# Patient Record
Sex: Female | Born: 1935 | Race: Black or African American | Hispanic: No | Smoking: Former smoker
Health system: Southern US, Community
[De-identification: ages and names within clinical notes are randomized; demographics above are authoritative.]

## PROBLEM LIST (undated history)

## (undated) DIAGNOSIS — S43004S Unspecified dislocation of right shoulder joint, sequela: Secondary | ICD-10-CM

## (undated) DIAGNOSIS — C50919 Malignant neoplasm of unspecified site of unspecified female breast: Secondary | ICD-10-CM

## (undated) DIAGNOSIS — H409 Unspecified glaucoma: Secondary | ICD-10-CM

## (undated) DIAGNOSIS — F0390 Unspecified dementia without behavioral disturbance: Secondary | ICD-10-CM

## (undated) DIAGNOSIS — I10 Essential (primary) hypertension: Secondary | ICD-10-CM

## (undated) HISTORY — PX: EYE SURGERY: SHX253

## (undated) HISTORY — PX: JOINT REPLACEMENT: SHX530

## (undated) HISTORY — PX: OTHER SURGICAL HISTORY: SHX169

## (undated) HISTORY — PX: BREAST SURGERY: SHX581

## (undated) HISTORY — PX: ABDOMINAL HYSTERECTOMY: SHX81

## (undated) HISTORY — PX: MASTECTOMY: SHX3

---

## 2002-08-26 ENCOUNTER — Encounter: Payer: Self-pay | Admitting: Neurosurgery

## 2002-08-26 ENCOUNTER — Encounter: Admission: RE | Admit: 2002-08-26 | Discharge: 2002-08-26 | Payer: Self-pay | Admitting: Neurosurgery

## 2004-03-29 ENCOUNTER — Ambulatory Visit: Payer: Self-pay | Admitting: Internal Medicine

## 2004-09-27 ENCOUNTER — Ambulatory Visit: Payer: Self-pay | Admitting: Internal Medicine

## 2004-10-19 ENCOUNTER — Ambulatory Visit: Payer: Self-pay | Admitting: Internal Medicine

## 2005-05-30 ENCOUNTER — Ambulatory Visit: Payer: Self-pay | Admitting: Internal Medicine

## 2005-06-12 ENCOUNTER — Ambulatory Visit: Payer: Self-pay | Admitting: Internal Medicine

## 2005-06-27 ENCOUNTER — Ambulatory Visit: Payer: Self-pay | Admitting: Surgery

## 2005-07-15 ENCOUNTER — Ambulatory Visit: Payer: Self-pay | Admitting: Gastroenterology

## 2005-07-16 ENCOUNTER — Other Ambulatory Visit: Payer: Self-pay

## 2005-07-23 ENCOUNTER — Observation Stay: Payer: Self-pay | Admitting: Surgery

## 2005-08-12 ENCOUNTER — Ambulatory Visit: Payer: Self-pay | Admitting: Oncology

## 2005-09-20 ENCOUNTER — Ambulatory Visit: Payer: Self-pay | Admitting: Oncology

## 2005-10-01 ENCOUNTER — Ambulatory Visit: Payer: Self-pay | Admitting: Surgery

## 2005-10-08 ENCOUNTER — Ambulatory Visit: Payer: Self-pay | Admitting: Oncology

## 2005-11-08 ENCOUNTER — Ambulatory Visit: Payer: Self-pay | Admitting: Oncology

## 2005-12-08 ENCOUNTER — Ambulatory Visit: Payer: Self-pay | Admitting: Oncology

## 2006-01-08 ENCOUNTER — Ambulatory Visit: Payer: Self-pay | Admitting: Oncology

## 2006-02-08 ENCOUNTER — Ambulatory Visit: Payer: Self-pay | Admitting: Oncology

## 2006-03-10 ENCOUNTER — Ambulatory Visit: Payer: Self-pay | Admitting: Oncology

## 2006-04-18 ENCOUNTER — Ambulatory Visit: Payer: Self-pay | Admitting: Oncology

## 2006-05-10 ENCOUNTER — Ambulatory Visit: Payer: Self-pay | Admitting: Oncology

## 2006-07-17 ENCOUNTER — Ambulatory Visit: Payer: Self-pay | Admitting: Internal Medicine

## 2006-08-09 ENCOUNTER — Ambulatory Visit: Payer: Self-pay | Admitting: Internal Medicine

## 2006-08-15 IMAGING — US ULTRASOUND RIGHT BREAST
1 series · 17 of 25 positions shown · non-contrast
Comparison: none

REASON FOR EXAM: Density  US PRN
COMMENTS:

[Series 1: ultrasound right breast · 17 of 44 slices shown]
[im 1/44]
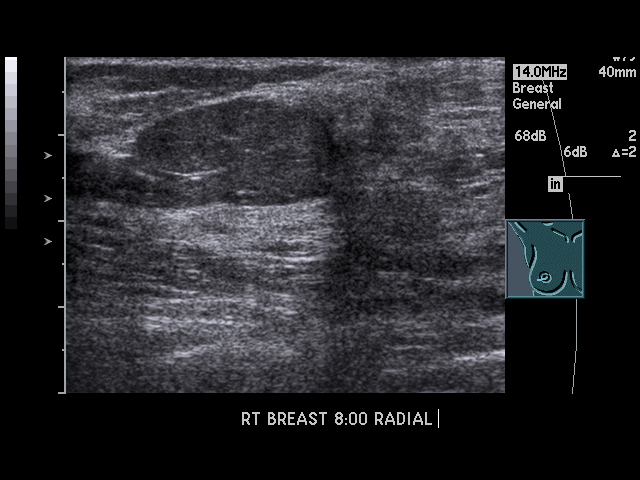
[im 4/44]
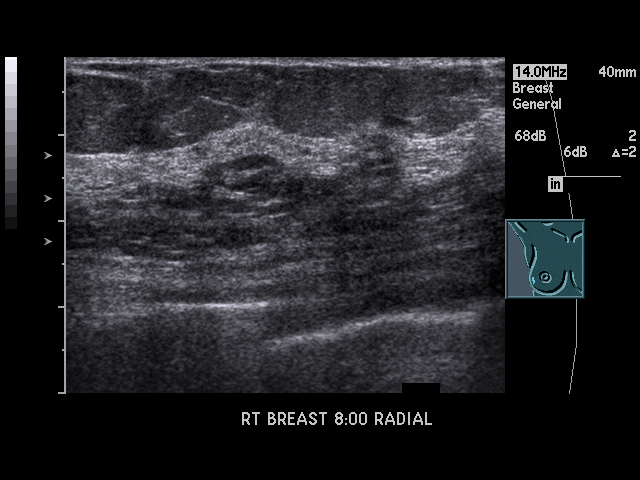
[im 6/44]
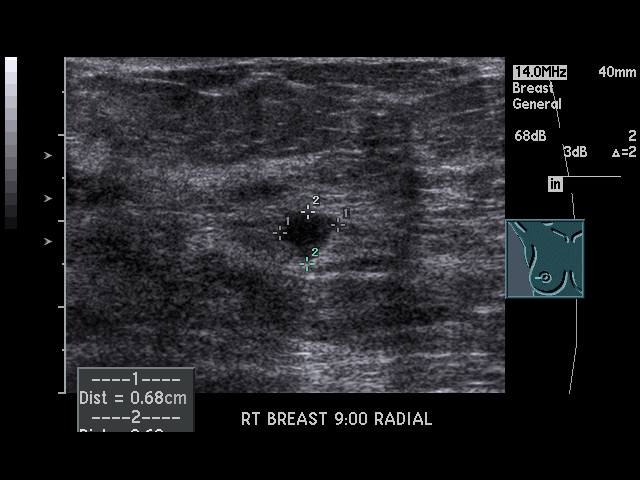
[im 9/44]
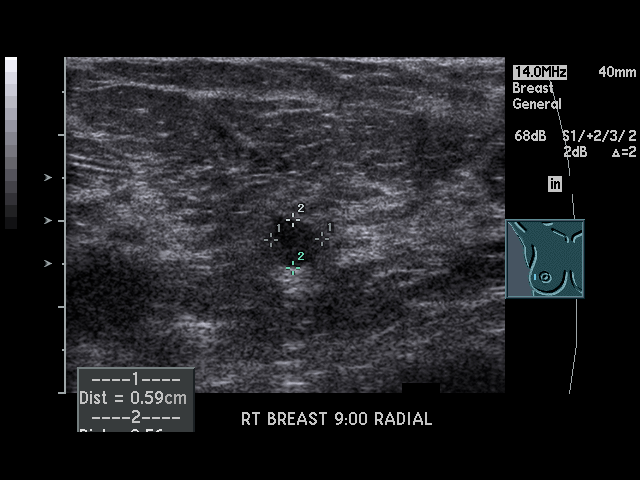
[im 11/44]
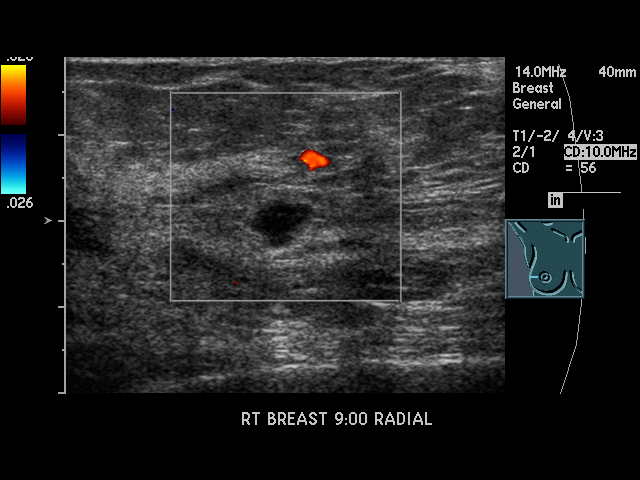
[im 15/44]
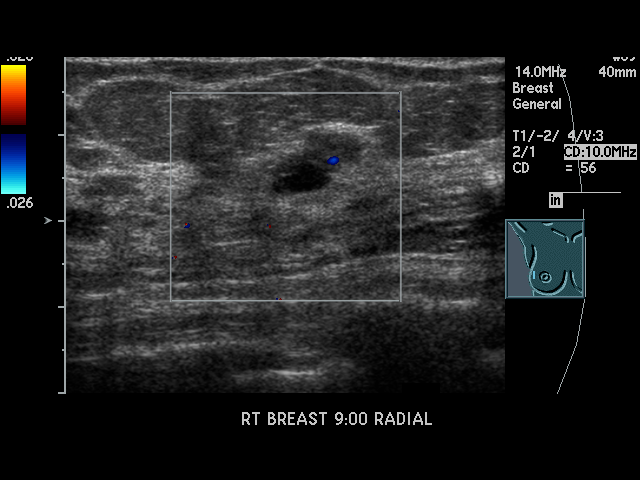
[im 17/44]
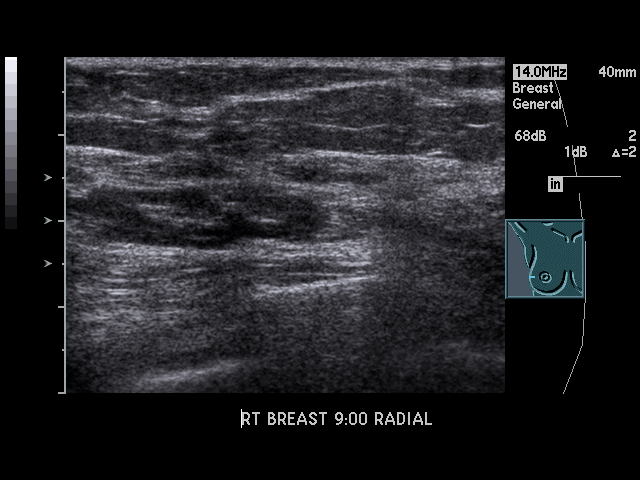
[im 20/44]
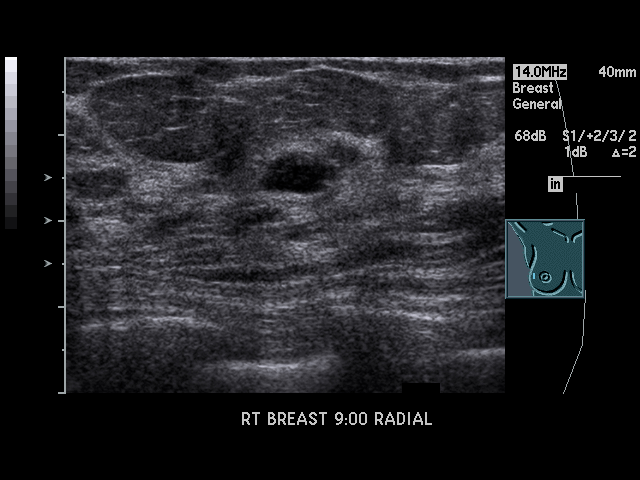
[im 22/44]
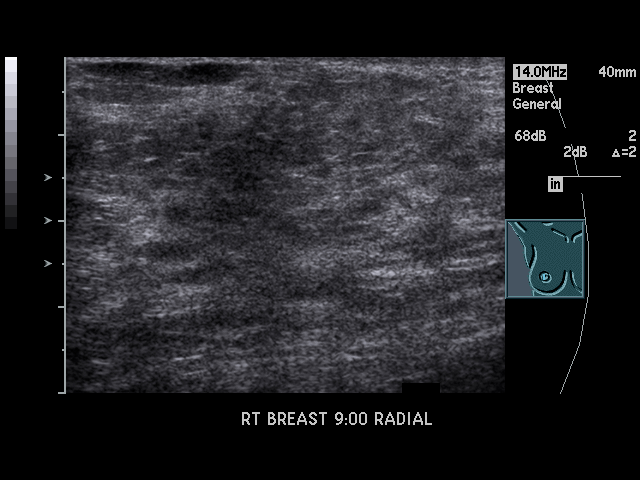
[im 24/44]
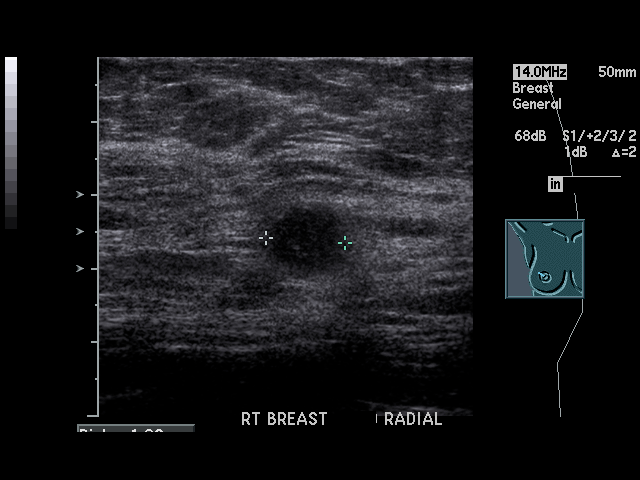
[im 27/44]
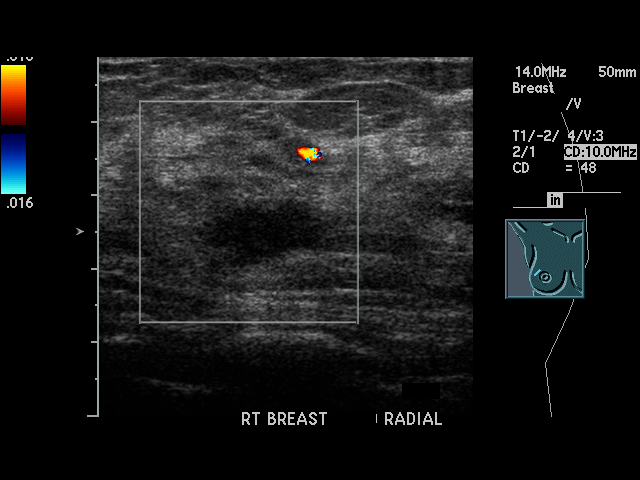
[im 29/44]
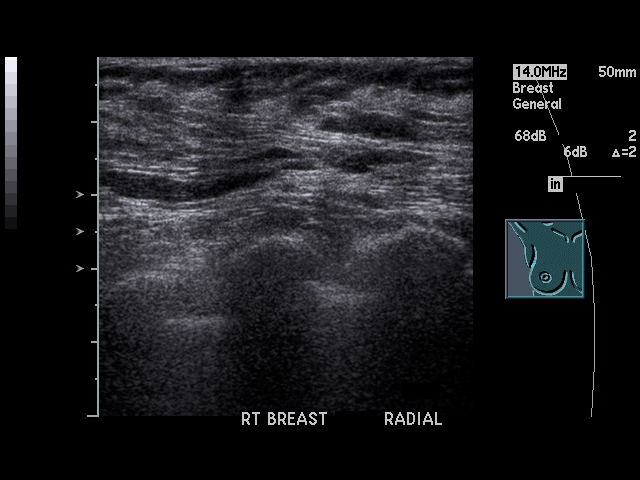
[im 33/44]
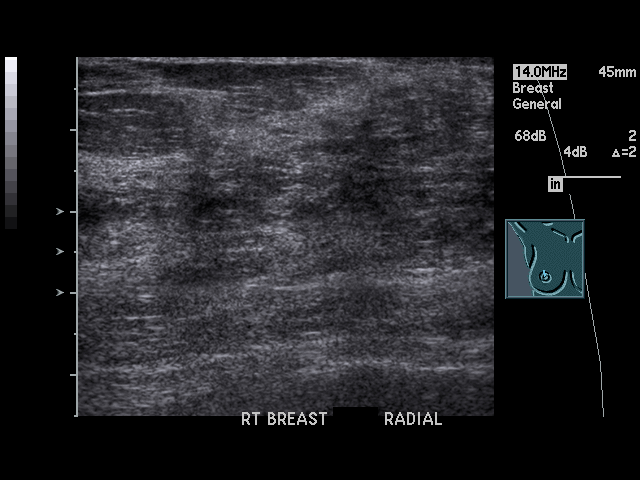
[im 35/44]
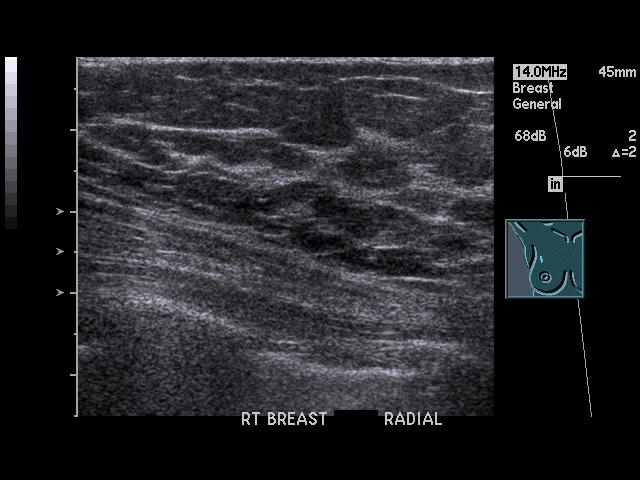
[im 38/44]
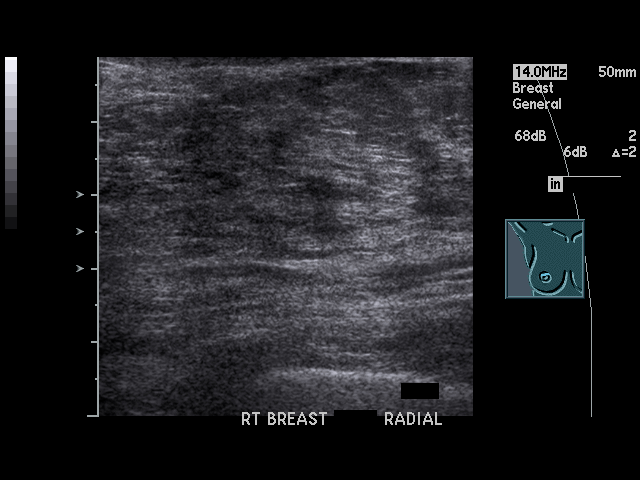
[im 40/44]
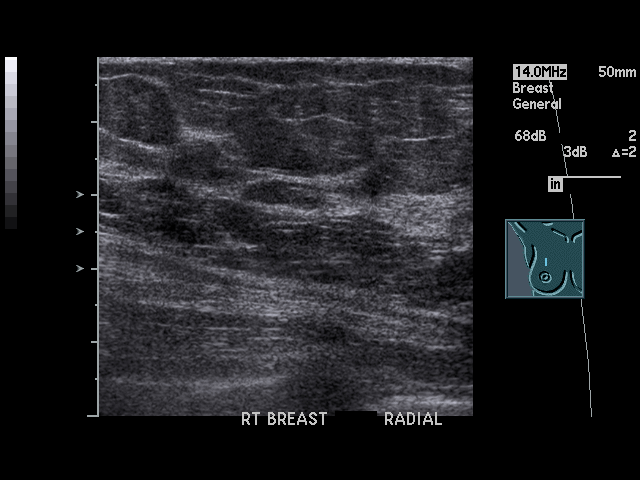
[im 44/44]
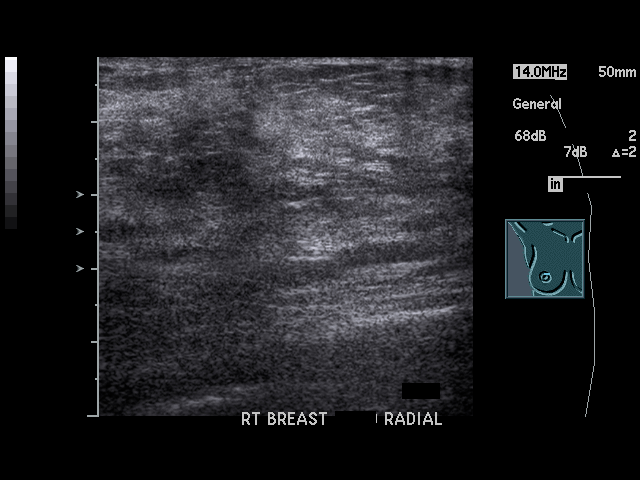

[17 of 25 positions shown; findings below may reference images not displayed]

PROCEDURE:     US  - US BREAST RIGHT  - June 12, 2005 [DATE]

RESULT:     The outer aspect of the RIGHT breast from approximately the 7
o'clock to 12 o'clock position was scanned. This was to assist in evaluation
of density seen mammographically.

At the 9 o'clock position there is density seen medially measuring 7 x 6 x 6
mm.  There are internal echoes present.  More peripherally there is a
hypoechoic nodule measuring approximately 1.3 x 1 cm with internal echoes.
At the 10 o'clock position there is a hypoechoic structure demonstrating
internal echoes measuring 1.1 x 1.6 x 0.8 cm.
IMPRESSION: There are hypoechoic non-simple appearing cystic structures present at the 9
o'clock and 10 o'clock positions.  Please see the dictation for the
supplemental mammographic views of this same day for final recommendations
and BI-RADS classification.

## 2006-10-09 ENCOUNTER — Ambulatory Visit: Payer: Self-pay | Admitting: Internal Medicine

## 2006-10-17 ENCOUNTER — Ambulatory Visit: Payer: Self-pay | Admitting: Oncology

## 2006-11-09 ENCOUNTER — Ambulatory Visit: Payer: Self-pay | Admitting: Internal Medicine

## 2006-11-09 ENCOUNTER — Ambulatory Visit: Payer: Self-pay | Admitting: Oncology

## 2007-04-11 ENCOUNTER — Ambulatory Visit: Payer: Self-pay | Admitting: Oncology

## 2007-04-15 ENCOUNTER — Ambulatory Visit: Payer: Self-pay | Admitting: Oncology

## 2007-05-11 ENCOUNTER — Ambulatory Visit: Payer: Self-pay | Admitting: Oncology

## 2007-08-09 ENCOUNTER — Ambulatory Visit: Payer: Self-pay | Admitting: Oncology

## 2007-09-03 ENCOUNTER — Ambulatory Visit: Payer: Self-pay | Admitting: Internal Medicine

## 2007-09-09 ENCOUNTER — Ambulatory Visit: Payer: Self-pay | Admitting: Internal Medicine

## 2007-10-09 ENCOUNTER — Ambulatory Visit: Payer: Self-pay | Admitting: Cardiology

## 2007-12-09 ENCOUNTER — Ambulatory Visit: Payer: Self-pay | Admitting: Oncology

## 2008-01-01 ENCOUNTER — Ambulatory Visit: Payer: Self-pay | Admitting: Oncology

## 2008-01-09 ENCOUNTER — Ambulatory Visit: Payer: Self-pay | Admitting: Oncology

## 2008-06-10 ENCOUNTER — Ambulatory Visit: Payer: Self-pay | Admitting: Oncology

## 2008-06-30 ENCOUNTER — Ambulatory Visit: Payer: Self-pay | Admitting: Oncology

## 2008-07-11 ENCOUNTER — Ambulatory Visit: Payer: Self-pay | Admitting: Oncology

## 2008-12-08 ENCOUNTER — Ambulatory Visit: Payer: Self-pay | Admitting: Oncology

## 2008-12-27 ENCOUNTER — Ambulatory Visit: Payer: Self-pay | Admitting: Oncology

## 2009-01-08 ENCOUNTER — Ambulatory Visit: Payer: Self-pay | Admitting: Oncology

## 2009-08-08 ENCOUNTER — Ambulatory Visit: Payer: Self-pay | Admitting: Oncology

## 2009-08-29 ENCOUNTER — Ambulatory Visit: Payer: Self-pay | Admitting: Ophthalmology

## 2009-09-05 ENCOUNTER — Ambulatory Visit: Payer: Self-pay | Admitting: Ophthalmology

## 2009-09-08 ENCOUNTER — Ambulatory Visit: Payer: Self-pay | Admitting: Oncology

## 2009-10-24 ENCOUNTER — Ambulatory Visit: Payer: Self-pay | Admitting: Ophthalmology

## 2009-10-31 ENCOUNTER — Ambulatory Visit: Payer: Self-pay | Admitting: Ophthalmology

## 2010-02-08 ENCOUNTER — Ambulatory Visit: Payer: Self-pay | Admitting: Oncology

## 2010-03-07 ENCOUNTER — Ambulatory Visit: Payer: Self-pay | Admitting: Oncology

## 2010-03-08 LAB — CANCER ANTIGEN 27.29: CA 27.29: 20.3 U/mL (ref 0.0–38.6)

## 2010-03-10 ENCOUNTER — Ambulatory Visit: Payer: Self-pay | Admitting: Oncology

## 2010-07-31 ENCOUNTER — Ambulatory Visit: Payer: Self-pay | Admitting: Gastroenterology

## 2010-10-03 ENCOUNTER — Ambulatory Visit: Payer: Self-pay | Admitting: Oncology

## 2010-10-04 LAB — CANCER ANTIGEN 27.29: CA 27.29: 20.9 U/mL (ref 0.0–38.6)

## 2010-10-09 ENCOUNTER — Ambulatory Visit: Payer: Self-pay | Admitting: Oncology

## 2010-11-09 ENCOUNTER — Ambulatory Visit: Payer: Self-pay | Admitting: Oncology

## 2011-02-13 ENCOUNTER — Ambulatory Visit: Payer: Self-pay | Admitting: Oncology

## 2011-02-14 LAB — CANCER ANTIGEN 27.29: CA 27.29: 23.4 U/mL (ref 0.0–38.6)

## 2011-03-11 ENCOUNTER — Ambulatory Visit: Payer: Self-pay | Admitting: Oncology

## 2014-12-27 NOTE — Progress Notes (Signed)
Patient mailing address is [address], [place], [place]  [address].  Only patient's physical address is listed in demographics ([place], [place], [place]  [address])

## 2016-11-05 ENCOUNTER — Encounter: Payer: Self-pay | Admitting: *Deleted

## 2016-11-05 ENCOUNTER — Encounter
Admission: RE | Admit: 2016-11-05 | Discharge: 2016-11-05 | Disposition: A | Discharge status: Home / Self Care | Payer: Federal, State, Local not specified - PPO | Source: Ambulatory Visit | Attending: Surgery | Admitting: Surgery

## 2016-11-05 DIAGNOSIS — M7591 Shoulder lesion, unspecified, right shoulder: Secondary | ICD-10-CM | POA: Diagnosis not present

## 2016-11-05 DIAGNOSIS — I10 Essential (primary) hypertension: Secondary | ICD-10-CM | POA: Diagnosis present

## 2016-11-05 DIAGNOSIS — R001 Bradycardia, unspecified: Secondary | ICD-10-CM | POA: Diagnosis not present

## 2016-11-05 DIAGNOSIS — Z79899 Other long term (current) drug therapy: Secondary | ICD-10-CM | POA: Diagnosis not present

## 2016-11-05 DIAGNOSIS — Z01818 Encounter for other preprocedural examination: Secondary | ICD-10-CM | POA: Insufficient documentation

## 2016-11-05 HISTORY — DX: Unspecified glaucoma: H40.9

## 2016-11-05 HISTORY — DX: Essential (primary) hypertension: I10

## 2016-11-05 LAB — TYPE AND SCREEN
ABO/RH(D): B POS
Antibody Screen: NEGATIVE

## 2016-11-05 LAB — URINALYSIS, ROUTINE W REFLEX MICROSCOPIC
BACTERIA UA: NONE SEEN
Bilirubin Urine: NEGATIVE
Glucose, UA: NEGATIVE mg/dL
Ketones, ur: NEGATIVE mg/dL
LEUKOCYTES UA: NEGATIVE
NITRITE: NEGATIVE
PH: 6 (ref 5.0–8.0)
Protein, ur: NEGATIVE mg/dL
SPECIFIC GRAVITY, URINE: 1.008 (ref 1.005–1.030)
SQUAMOUS EPITHELIAL / LPF: NONE SEEN

## 2016-11-05 LAB — CBC
HEMATOCRIT: 36.8 % (ref 35.0–47.0)
Hemoglobin: 12.3 g/dL (ref 12.0–16.0)
MCH: 32.7 pg (ref 26.0–34.0)
MCHC: 33.5 g/dL (ref 32.0–36.0)
MCV: 97.6 fL (ref 80.0–100.0)
PLATELETS: 256 10*3/uL (ref 150–440)
RBC: 3.77 MIL/uL — ABNORMAL LOW (ref 3.80–5.20)
RDW: 14.9 % — AB (ref 11.5–14.5)
WBC: 3.8 10*3/uL (ref 3.6–11.0)

## 2016-11-05 LAB — BASIC METABOLIC PANEL
ANION GAP: 9 (ref 5–15)
BUN: 27 mg/dL — AB (ref 6–20)
CALCIUM: 10.8 mg/dL — AB (ref 8.9–10.3)
CO2: 28 mmol/L (ref 22–32)
Chloride: 102 mmol/L (ref 101–111)
Creatinine, Ser: 0.98 mg/dL (ref 0.44–1.00)
GFR calc Af Amer: 59 mL/min — ABNORMAL LOW (ref >60)
GFR, EST NON AFRICAN AMERICAN: 51 mL/min — AB (ref >60)
GLUCOSE: 101 mg/dL — AB (ref 65–99)
Potassium: 3.7 mmol/L (ref 3.5–5.1)
Sodium: 139 mmol/L (ref 135–145)

## 2016-11-05 LAB — PROTIME-INR
INR: 1.06
Prothrombin Time: 13.8 seconds (ref 11.4–15.2)

## 2016-11-05 LAB — SURGICAL PCR SCREEN
MRSA, PCR: NEGATIVE
STAPHYLOCOCCUS AUREUS: POSITIVE — AB

## 2016-11-05 NOTE — Patient Instructions (Signed)
  Your procedure is scheduled OF:[facility]W. November 19, 2016 Report to Day Surgery. To find out your arrival time please call [phone] between 1PM - 3PM on Mon. November 18, 2016.  Remember: Instructions that are not followed completely may result in serious medical risk, up to and including death, or upon the discretion of your surgeon and anesthesiologist your surgery may need to be rescheduled.    _x___ 1. Do not eat food or drink liquids after midnight. No gum chewing or hard candies.     _x___ 2. No Alcohol for 24 hours before or after surgery.   ____ 3. Do Not Smoke For 24 Hours Prior to Your Surgery.   ____ 4. Bring all medications with you on the day of surgery if instructed.    __x__ 5. Notify your doctor if there is any change in your medical condition     (cold, fever, infections).       Do not wear jewelry, make-up, hairpins, clips or nail polish.  Do not wear lotions, powders, or perfumes. You may wear deodorant.  Do not shave 48 hours prior to surgery. Men may shave face and neck.  Do not bring valuables to the hospital.    [facility] is not responsible for any belongings or valuables.               Contacts, dentures or bridgework may not be worn into surgery.  Leave your suitcase in the car. After surgery it may be brought to your room.  For patients admitted to the hospital, discharge time is determined by your                treatment team.   Patients discharged the day of surgery will not be allowed to drive home.   Please read over the following fact sheets that you were given:   MRSA Information   _x___ Take these medicines the morning of surgery with A SIP OF WATER:    1. labetalol (NORMODYNE) 300 MG tablet  2. losartan (COZAAR) 100 MG tablet  3.   4.  5.  6.  ____ Fleet Enema (as directed)   __x__ Use CHG Soap as directed  ____ Use inhalers on the day of surgery  ____ Stop metformin 2 days prior to surgery    ____ Take 1/2 of usual insulin dose the  night before surgery and none on the morning of surgery.   ____ Stop Coumadin/Plavix/aspirin on   __x__ Stop Anti-inflammatories on June 5 stop advil, aleve, bc powder, aspirin.  May take tylenol   ____ Stop supplements until after surgery.    ____ Bring C-Pap to the hospital.

## 2016-11-06 LAB — URINE CULTURE
Culture: NO GROWTH
SPECIAL REQUESTS: NORMAL

## 2016-11-08 NOTE — Pre-Procedure Instructions (Signed)
UA sent to Dr. Poggi for review. 

## 2016-11-18 MED ORDER — CEFAZOLIN SODIUM-DEXTROSE 2-4 GM/100ML-% IV SOLN: 2.0000 g | Freq: Once | INTRAVENOUS | Status: DC

## 2016-11-19 ENCOUNTER — Ambulatory Visit
Admission: RE | Admit: 2016-11-19 | Discharge: 2016-11-19 | Disposition: A | Discharge status: Home / Self Care | Payer: Federal, State, Local not specified - PPO | Source: Ambulatory Visit | Attending: Surgery | Admitting: Surgery

## 2016-11-19 ENCOUNTER — Encounter
Admission: RE | Disposition: A | Discharge status: Still In Hospital | Payer: Self-pay | Source: Ambulatory Visit | Attending: Surgery

## 2016-11-19 ENCOUNTER — Emergency Department
Admission: EM | Admit: 2016-11-19 | Discharge: 2016-11-19 | Disposition: A | Discharge status: Home / Self Care | Payer: Federal, State, Local not specified - PPO | Attending: Emergency Medicine | Admitting: Emergency Medicine

## 2016-11-19 ENCOUNTER — Encounter: Payer: Self-pay | Admitting: Emergency Medicine

## 2016-11-19 ENCOUNTER — Encounter: Payer: Self-pay | Admitting: *Deleted

## 2016-11-19 DIAGNOSIS — Z87891 Personal history of nicotine dependence: Secondary | ICD-10-CM | POA: Insufficient documentation

## 2016-11-19 DIAGNOSIS — Z79899 Other long term (current) drug therapy: Secondary | ICD-10-CM | POA: Insufficient documentation

## 2016-11-19 DIAGNOSIS — Z5309 Procedure and treatment not carried out because of other contraindication: Secondary | ICD-10-CM | POA: Diagnosis not present

## 2016-11-19 DIAGNOSIS — I10 Essential (primary) hypertension: Secondary | ICD-10-CM | POA: Insufficient documentation

## 2016-11-19 DIAGNOSIS — Z96653 Presence of artificial knee joint, bilateral: Secondary | ICD-10-CM | POA: Insufficient documentation

## 2016-11-19 DIAGNOSIS — M75122 Complete rotator cuff tear or rupture of left shoulder, not specified as traumatic: Secondary | ICD-10-CM | POA: Insufficient documentation

## 2016-11-19 LAB — ABO/RH: ABO/RH(D): B POS

## 2016-11-19 SURGERY — ARTHROPLASTY, SHOULDER, TOTAL, REVERSE
Anesthesia: Choice | Laterality: Right

## 2016-11-19 MED ORDER — BUPIVACAINE-EPINEPHRINE (PF) 0.5% -1:200000 IJ SOLN
INTRAMUSCULAR | Status: AC
  Filled 2016-11-19: qty 30

## 2016-11-19 MED ORDER — NEOMYCIN-POLYMYXIN B GU 40-200000 IR SOLN
Status: AC
  Filled 2016-11-19: qty 20

## 2016-11-19 MED ORDER — CEFAZOLIN SODIUM-DEXTROSE 2-4 GM/100ML-% IV SOLN
INTRAVENOUS | Status: AC
  Filled 2016-11-19: qty 100

## 2016-11-19 MED ORDER — LABETALOL HCL 100 MG PO TABS
300.0000 mg | ORAL_TABLET | Freq: Once | ORAL | Status: AC
  Administered 2016-11-19: 300 mg via ORAL
  Filled 2016-11-19 (×2): qty 3

## 2016-11-19 MED ORDER — FAMOTIDINE 20 MG PO TABS
20.0000 mg | ORAL_TABLET | Freq: Once | ORAL | Status: AC
  Administered 2016-11-19: 20 mg via ORAL

## 2016-11-19 MED ORDER — SODIUM CHLORIDE 0.9 % IJ SOLN
INTRAMUSCULAR | Status: AC
  Filled 2016-11-19: qty 50

## 2016-11-19 MED ORDER — LACTATED RINGERS IV SOLN: INTRAVENOUS | Status: DC

## 2016-11-19 MED ORDER — FAMOTIDINE 20 MG PO TABS
ORAL_TABLET | ORAL | Status: AC
  Administered 2016-11-19: 20 mg via ORAL
  Filled 2016-11-19: qty 1

## 2016-11-19 MED ORDER — HYDRALAZINE HCL 20 MG/ML IJ SOLN: 5.0000 mg | Freq: Once | INTRAMUSCULAR | Status: DC

## 2016-11-19 MED ORDER — TRANEXAMIC ACID 1000 MG/10ML IV SOLN
INTRAVENOUS | Status: AC
  Filled 2016-11-19: qty 10

## 2016-11-19 MED ORDER — LOSARTAN POTASSIUM 50 MG PO TABS
100.0000 mg | ORAL_TABLET | Freq: Once | ORAL | Status: AC
  Administered 2016-11-19: 100 mg via ORAL
  Filled 2016-11-19: qty 2

## 2016-11-19 MED ORDER — BUPIVACAINE LIPOSOME 1.3 % IJ SUSP
INTRAMUSCULAR | Status: AC
  Filled 2016-11-19: qty 20

## 2016-11-19 SURGICAL SUPPLY — 58 items
BLADE SAGITTAL WIDE XTHICK NO (BLADE) ×3 IMPLANT
CANISTER SUCT 1200ML W/VALVE (MISCELLANEOUS) ×3 IMPLANT
CANISTER SUCT 3000ML PPV (MISCELLANEOUS) ×6 IMPLANT
CATH TRAY METER 16FR LF (MISCELLANEOUS) ×3 IMPLANT
CHLORAPREP W/TINT 26ML (MISCELLANEOUS) ×3 IMPLANT
COOLER POLAR GLACIER W/PUMP (MISCELLANEOUS) ×3 IMPLANT
CRADLE LAMINECT ARM (MISCELLANEOUS) ×3 IMPLANT
DRAPE IMP U-DRAPE 54X76 (DRAPES) ×6 IMPLANT
DRAPE INCISE IOBAN 66X45 STRL (DRAPES) ×6 IMPLANT
DRAPE INCISE IOBAN 66X60 STRL (DRAPES) ×3 IMPLANT
DRAPE SHEET LG 3/4 BI-LAMINATE (DRAPES) ×6 IMPLANT
DRAPE TABLE BACK 80X90 (DRAPES) ×3 IMPLANT
DRSG OPSITE POSTOP 4X8 (GAUZE/BANDAGES/DRESSINGS) ×3 IMPLANT
ELECT CAUTERY BLADE 6.4 (BLADE) ×3 IMPLANT
GLOVE BIO SURGEON STRL SZ7.5 (GLOVE) ×12 IMPLANT
GLOVE BIO SURGEON STRL SZ8 (GLOVE) ×12 IMPLANT
GLOVE BIOGEL PI IND STRL 8 (GLOVE) ×1 IMPLANT
GLOVE BIOGEL PI INDICATOR 8 (GLOVE) ×2
GLOVE INDICATOR 8.0 STRL GRN (GLOVE) ×3 IMPLANT
GOWN STRL REUS W/ TWL LRG LVL3 (GOWN DISPOSABLE) ×1 IMPLANT
GOWN STRL REUS W/ TWL XL LVL3 (GOWN DISPOSABLE) ×1 IMPLANT
GOWN STRL REUS W/TWL LRG LVL3 (GOWN DISPOSABLE) ×2
GOWN STRL REUS W/TWL XL LVL3 (GOWN DISPOSABLE) ×2
HOOD PEEL AWAY FLYTE STAYCOOL (MISCELLANEOUS) ×9 IMPLANT
KIT RM TURNOVER STRD PROC AR (KITS) ×3 IMPLANT
KIT STABILIZATION SHOULDER (MISCELLANEOUS) ×3 IMPLANT
MASK FACE SPIDER DISP (MASK) ×3 IMPLANT
NDL MAYO CATGUT SZ1 (NEEDLE)
NDL MAYO CATGUT SZ5 (NEEDLE)
NDL SAFETY 22GX1.5 (NEEDLE) ×3 IMPLANT
NDL SUT 5 .5 CRC TPR PNT MAYO (NEEDLE) IMPLANT
NEEDLE 18GX1X1/2 (RX/OR ONLY) (NEEDLE) ×3 IMPLANT
NEEDLE HYPO 25X1 1.5 SAFETY (NEEDLE) ×3 IMPLANT
NEEDLE MAYO CATGUT SZ1 (NEEDLE) IMPLANT
NEEDLE MAYO CATGUT SZ4 (NEEDLE) IMPLANT
NEEDLE SPNL 20GX3.5 QUINCKE YW (NEEDLE) ×3 IMPLANT
NS IRRIG 1000ML POUR BTL (IV SOLUTION) ×3 IMPLANT
PACK ARTHROSCOPY SHOULDER (MISCELLANEOUS) ×3 IMPLANT
PAD WRAPON POLAR SHDR UNIV (MISCELLANEOUS) ×1 IMPLANT
PULSAVAC PLUS IRRIG FAN TIP (DISPOSABLE) ×3
SLING ULTRA II M (MISCELLANEOUS) ×3 IMPLANT
SOL .9 NS 3000ML IRR  AL (IV SOLUTION) ×2
SOL .9 NS 3000ML IRR UROMATIC (IV SOLUTION) ×1 IMPLANT
SPONGE LAP 18X18 5 PK (GAUZE/BANDAGES/DRESSINGS) ×3 IMPLANT
STAPLER SKIN PROX 35W (STAPLE) ×3 IMPLANT
SUT ETHIBOND 0 MO6 C/R (SUTURE) ×3 IMPLANT
SUT ETHIBOND NAB CT1 #1 30IN (SUTURE) ×3 IMPLANT
SUT FIBERWIRE #2 38 BLUE 1/2 (SUTURE) ×12
SUT VIC AB 0 CT1 36 (SUTURE) ×6 IMPLANT
SUT VIC AB 2-0 CT1 27 (SUTURE) ×4
SUT VIC AB 2-0 CT1 TAPERPNT 27 (SUTURE) ×2 IMPLANT
SUTURE FIBERWR #2 38 BLUE 1/2 (SUTURE) ×4 IMPLANT
SYR 30ML LL (SYRINGE) ×6 IMPLANT
SYRINGE 10CC LL (SYRINGE) ×3 IMPLANT
TIP FAN IRRIG PULSAVAC PLUS (DISPOSABLE) ×1 IMPLANT
TUBE CONNECTING  6'X3/16 (MISCELLANEOUS) ×1
TUBE CONNECTING 6X3/16 (MISCELLANEOUS) ×2 IMPLANT
WRAPON POLAR PAD SHDR UNIV (MISCELLANEOUS) ×3

## 2016-11-19 NOTE — ED Triage Notes (Addendum)
Pt here from same day surgery due to hypertension. Pt reports she was supposed to have right shoulder surgery due to limited ROM from previous fall. Pt denies any complaints at this time, anesthesia sent down to ED for eval. Pt reports she did not take her HTN medication due to being NPO this morning but has been taking it and took it last night.

## 2016-11-19 NOTE — Progress Notes (Signed)
Patient arrived for surgery. BP checked, elevated 240s/100s. Anesthesia and surgeon asked to send patient to ED. Patient brought to ED via [facility].

## 2016-11-19 NOTE — ED Provider Notes (Signed)
[facility] Emergency Department Provider Note   ____________________________________________    I have reviewed the triage vital signs and the nursing notes.   HISTORY  Chief Complaint Hypertension     HPI Kimberly Bartlett is a 83 y.o. female who presents with complaints of high blood pressure. Patient had procedure scheduled today but was sent to the ED by anesthesiology for evaluation because of high blood pressure. Patient reports she did not take her blood pressure medications this morning because she is nothing by mouth. She reports a long history of high blood pressure. She has no physical complaints. She feels quite well. No chest pain or shortness of breath.   Past Medical History:  Diagnosis Date  . Glaucoma    both eyes  . Hypertension     There are no active problems to display for this patient.   Past Surgical History:  Procedure Laterality Date  . ABDOMINAL HYSTERECTOMY    . BREAST SURGERY     bialteral mastectomy  . EYE SURGERY     bilateral cataracts  . hammer toe Bilateral   . heel spur Bilateral   . JOINT REPLACEMENT     bilateral knees  . MASTECTOMY     bilateral    Prior to Admission medications   Medication Sig Start Date End Date Taking? Authorizing Provider  bimatoprost (LUMIGAN) 0.01 % SOLN Place 1 drop into both eyes at bedtime.   Yes [provider]  brimonidine-timolol (COMBIGAN) 0.2-0.5 % ophthalmic solution Place 1 drop into both eyes 2 (two) times daily.   Yes [provider]  Cholecalciferol (VITAMIN D3) 2000 units capsule Take 2,000 Units by mouth daily.   Yes [provider]  colchicine-probenecid 0.5-500 MG tablet Take 1 tablet by mouth daily.   Yes [provider]  labetalol (NORMODYNE) 300 MG tablet Take 300 mg by mouth 2 (two) times daily.   Yes [provider]  losartan (COZAAR) 100 MG tablet Take 100 mg by mouth daily.   Yes [provider]    torsemide (DEMADEX) 10 MG tablet Take 10 mg by mouth daily.   Yes [provider]     Allergies Patient has no known allergies.  No family history on file.  Social History Social History  Substance Use Topics  . Smoking status: Former Smoker    Types: Cigarettes    Quit date: 11/05/1948  . Smokeless tobacco: Never Used  . Alcohol use Yes     Comment: occassional wine    Review of Systems  Constitutional: No fever/chills Eyes: No visual changes.  ENT: No sore throat. Cardiovascular: Denies chest pain. Respiratory: Denies shortness of breath. Gastrointestinal: No abdominal pain.  No nausea, no vomiting.   Genitourinary: Negative for dysuria. Musculoskeletal: Negative for back pain. Skin: Negative for rash. Neurological: Negative for headaches or weakness   ____________________________________________   PHYSICAL EXAM:  VITAL SIGNS: ED Triage Vitals [11/19/16 1018]  Enc Vitals Group     BP (!) 171/103     Pulse Rate (!) 54     Resp 16     Temp 98.3 F (36.8 C)     Temp Source Oral     SpO2 99 %     Weight 81.6 kg (180 lb)     Height 1.575 m (5\' 2" )     Head Circumference      Peak Flow      Pain Score      Pain Loc  Pain Edu?      Excl. in [place]?     Constitutional: Alert and oriented. No acute distress. Pleasant and interactive Eyes: Conjunctivae are normal.  Head: Atraumatic. Nose: No congestion/rhinnorhea. Mouth/Throat: Mucous membranes are moist.    Cardiovascular: Normal rate, regular rhythm. Grossly normal heart sounds.  Good peripheral circulation. Respiratory: Normal respiratory effort.  No retractions. Lungs CTAB.  Genitourinary: deferred Musculoskeletal: No lower extremity tenderness nor edema.  Warm and well perfused Neurologic:  Normal speech and language. No gross focal neurologic deficits are appreciated.  Skin:  Skin is warm, dry and intact. No rash noted. Psychiatric: Mood and affect are normal. Speech and behavior are  normal.  ____________________________________________   LABS (all labs ordered are listed, but only abnormal results are displayed)  Labs Reviewed - No data to display ____________________________________________  EKG  ED ECG REPORT I, Lavonia Drafts, the attending physician, personally viewed and interpreted this ECG.  Date: 11/19/2016 EKG Time: 10:20 AM Rate: 58 Rhythm: normal sinus rhythm QRS Axis: normal Intervals: normal ST/T Wave abnormalities: Nonspecific Narrative Interpretation: unremarkable  ____________________________________________  RADIOLOGY  None ____________________________________________   PROCEDURES  Procedure(s) performed: No    Critical Care performed: No ____________________________________________   INITIAL IMPRESSION / ASSESSMENT AND PLAN / ED COURSE  Pertinent labs & imaging results that were available during my care of the patient were reviewed by me and considered in my medical decision making (see chart for details).  Patient well-appearing and asymptomatic. We will give her her home medications and reevaluate  Patient had some improvement with home medications. I recommended IV medications but the patient declined and said she was ready to go home. She states it will improve when she goes home and rests. She knows to return if any symptoms develop. She understands that her blood pressure is significantly elevated and potentially dangerous but has decisional capacity and does not want any IV medications or further treatment this time    ____________________________________________   FINAL CLINICAL IMPRESSION(S) / ED DIAGNOSES  Final diagnoses:  Essential hypertension      NEW MEDICATIONS STARTED DURING THIS VISIT:  Discharge Medication List as of 11/19/2016 12:35 PM       Note:  This document was prepared using Dragon voice recognition software and may include unintentional dictation errors.    Lavonia Drafts,  MD 11/19/16 [phone]

## 2016-11-19 NOTE — ED Notes (Signed)
Pt denies any pain, blurred vision, dizziness. Pt ambulatory around room without assistance. Pt states she feels fine and wants to go home.

## 2016-11-19 NOTE — ED Notes (Signed)
Patient denies pain and is resting comfortably.  

## 2016-11-19 NOTE — H&P (Addendum)
Procedure canceled as patient's blood pressure was 286/106 by machine and 240/100 manually. Therefore, the decision was made I anesthesia to cancel the procedure and have the patient admitted to the hospitalist service so that her blood pressure could be managed more optimally.  This was discussed with the patient who is in agreement with this plan. As soon as her blood pressure was under control, she will be rescheduled for her reverse right total shoulder arthroplasty.

## 2016-11-19 NOTE — ED Notes (Signed)
Pt assisted to toilet 

## 2017-11-15 ENCOUNTER — Emergency Department: Payer: Medicare Other

## 2017-11-15 ENCOUNTER — Other Ambulatory Visit: Payer: Self-pay

## 2017-11-15 ENCOUNTER — Inpatient Hospital Stay
Admission: EM | Admit: 2017-11-15 | Discharge: 2017-11-18 | DRG: 683 | Disposition: A | Discharge status: Skilled Nursing Facility | Payer: Medicare Other | Attending: Internal Medicine | Admitting: Internal Medicine

## 2017-11-15 ENCOUNTER — Encounter: Payer: Self-pay | Admitting: Emergency Medicine

## 2017-11-15 DIAGNOSIS — Z87891 Personal history of nicotine dependence: Secondary | ICD-10-CM | POA: Diagnosis not present

## 2017-11-15 DIAGNOSIS — M109 Gout, unspecified: Secondary | ICD-10-CM | POA: Diagnosis present

## 2017-11-15 DIAGNOSIS — Z6827 Body mass index (BMI) 27.0-27.9, adult: Secondary | ICD-10-CM | POA: Diagnosis not present

## 2017-11-15 DIAGNOSIS — M79652 Pain in left thigh: Secondary | ICD-10-CM | POA: Diagnosis present

## 2017-11-15 DIAGNOSIS — Z79899 Other long term (current) drug therapy: Secondary | ICD-10-CM

## 2017-11-15 DIAGNOSIS — W19XXXA Unspecified fall, initial encounter: Secondary | ICD-10-CM | POA: Diagnosis present

## 2017-11-15 DIAGNOSIS — R001 Bradycardia, unspecified: Secondary | ICD-10-CM | POA: Diagnosis not present

## 2017-11-15 DIAGNOSIS — T68XXXA Hypothermia, initial encounter: Secondary | ICD-10-CM | POA: Diagnosis present

## 2017-11-15 DIAGNOSIS — E669 Obesity, unspecified: Secondary | ICD-10-CM | POA: Diagnosis present

## 2017-11-15 DIAGNOSIS — N179 Acute kidney failure, unspecified: Secondary | ICD-10-CM | POA: Diagnosis not present

## 2017-11-15 DIAGNOSIS — H409 Unspecified glaucoma: Secondary | ICD-10-CM | POA: Diagnosis present

## 2017-11-15 DIAGNOSIS — E876 Hypokalemia: Secondary | ICD-10-CM | POA: Diagnosis present

## 2017-11-15 DIAGNOSIS — M6282 Rhabdomyolysis: Secondary | ICD-10-CM

## 2017-11-15 DIAGNOSIS — F0390 Unspecified dementia without behavioral disturbance: Secondary | ICD-10-CM | POA: Diagnosis present

## 2017-11-15 DIAGNOSIS — M25561 Pain in right knee: Secondary | ICD-10-CM

## 2017-11-15 DIAGNOSIS — Z96653 Presence of artificial knee joint, bilateral: Secondary | ICD-10-CM | POA: Diagnosis present

## 2017-11-15 DIAGNOSIS — Z853 Personal history of malignant neoplasm of breast: Secondary | ICD-10-CM

## 2017-11-15 DIAGNOSIS — R531 Weakness: Secondary | ICD-10-CM | POA: Diagnosis present

## 2017-11-15 DIAGNOSIS — E785 Hyperlipidemia, unspecified: Secondary | ICD-10-CM | POA: Diagnosis present

## 2017-11-15 DIAGNOSIS — I10 Essential (primary) hypertension: Secondary | ICD-10-CM | POA: Diagnosis present

## 2017-11-15 DIAGNOSIS — M79606 Pain in leg, unspecified: Secondary | ICD-10-CM

## 2017-11-15 DIAGNOSIS — M79651 Pain in right thigh: Secondary | ICD-10-CM | POA: Diagnosis present

## 2017-11-15 HISTORY — DX: Unspecified dislocation of right shoulder joint, sequela: S43.004S

## 2017-11-15 HISTORY — DX: Malignant neoplasm of unspecified site of unspecified female breast: C50.919

## 2017-11-15 LAB — URINALYSIS, COMPLETE (UACMP) WITH MICROSCOPIC
BACTERIA UA: NONE SEEN
Bilirubin Urine: NEGATIVE
Glucose, UA: NEGATIVE mg/dL
Ketones, ur: NEGATIVE mg/dL
LEUKOCYTES UA: NEGATIVE
NITRITE: NEGATIVE
PH: 5 (ref 5.0–8.0)
Protein, ur: 30 mg/dL — AB
Specific Gravity, Urine: 1.013 (ref 1.005–1.030)

## 2017-11-15 LAB — COMPREHENSIVE METABOLIC PANEL
ALK PHOS: 71 U/L (ref 38–126)
ALT: 78 U/L — ABNORMAL HIGH (ref 14–54)
AST: 159 U/L — AB (ref 15–41)
Albumin: 3.6 g/dL (ref 3.5–5.0)
Anion gap: 14 (ref 5–15)
BILIRUBIN TOTAL: 0.7 mg/dL (ref 0.3–1.2)
BUN: 76 mg/dL — AB (ref 6–20)
CALCIUM: 10.6 mg/dL — AB (ref 8.9–10.3)
CO2: 26 mmol/L (ref 22–32)
Chloride: 95 mmol/L — ABNORMAL LOW (ref 101–111)
Creatinine, Ser: 2.02 mg/dL — ABNORMAL HIGH (ref 0.44–1.00)
GFR calc Af Amer: 24 mL/min — ABNORMAL LOW (ref >60)
GFR calc non Af Amer: 21 mL/min — ABNORMAL LOW (ref >60)
GLUCOSE: 118 mg/dL — AB (ref 65–99)
Potassium: 3 mmol/L — ABNORMAL LOW (ref 3.5–5.1)
Sodium: 135 mmol/L (ref 135–145)
TOTAL PROTEIN: 7.6 g/dL (ref 6.5–8.1)

## 2017-11-15 LAB — CBC WITH DIFFERENTIAL/PLATELET
BASOS ABS: 0 10*3/uL (ref 0–0.1)
BASOS PCT: 0 %
EOS ABS: 0 10*3/uL (ref 0–0.7)
EOS PCT: 0 %
HCT: 39.7 % (ref 35.0–47.0)
Hemoglobin: 13.5 g/dL (ref 12.0–16.0)
LYMPHS PCT: 3 %
Lymphs Abs: 0.4 10*3/uL — ABNORMAL LOW (ref 1.0–3.6)
MCH: 33.1 pg (ref 26.0–34.0)
MCHC: 33.9 g/dL (ref 32.0–36.0)
MCV: 97.8 fL (ref 80.0–100.0)
MONO ABS: 0.6 10*3/uL (ref 0.2–0.9)
Monocytes Relative: 5 %
Neutro Abs: 10.4 10*3/uL — ABNORMAL HIGH (ref 1.4–6.5)
Neutrophils Relative %: 92 %
PLATELETS: 329 10*3/uL (ref 150–440)
RBC: 4.06 MIL/uL (ref 3.80–5.20)
RDW: 13.4 % (ref 11.5–14.5)
WBC: 11.4 10*3/uL — AB (ref 3.6–11.0)

## 2017-11-15 LAB — MAGNESIUM: MAGNESIUM: 2.4 mg/dL (ref 1.7–2.4)

## 2017-11-15 LAB — TROPONIN I: Troponin I: 0.04 ng/mL (ref <0.03)

## 2017-11-15 LAB — ETHANOL: Alcohol, Ethyl (B): <10 mg/dL (ref <10)

## 2017-11-15 LAB — AMMONIA: AMMONIA: 18 umol/L (ref 9–35)

## 2017-11-15 LAB — TSH: TSH: 1.527 u[IU]/mL (ref 0.350–4.500)

## 2017-11-15 LAB — CK: CK TOTAL: 10635 U/L — AB (ref 38–234)

## 2017-11-15 MED ORDER — ACETAMINOPHEN 650 MG RE SUPP: 650.0000 mg | Freq: Four times a day (QID) | RECTAL | Status: DC | PRN

## 2017-11-15 MED ORDER — LABETALOL HCL 200 MG PO TABS
300.0000 mg | ORAL_TABLET | Freq: Two times a day (BID) | ORAL | Status: DC
  Administered 2017-11-15 – 2017-11-17 (×4): 300 mg via ORAL
  Filled 2017-11-15 (×5): qty 1

## 2017-11-15 MED ORDER — AMLODIPINE BESYLATE 5 MG PO TABS
5.0000 mg | ORAL_TABLET | Freq: Every day | ORAL | Status: DC
  Administered 2017-11-16 – 2017-11-18 (×3): 5 mg via ORAL
  Filled 2017-11-15 (×3): qty 1

## 2017-11-15 MED ORDER — ONDANSETRON HCL 4 MG/2ML IJ SOLN: 4.0000 mg | Freq: Four times a day (QID) | INTRAMUSCULAR | Status: DC | PRN

## 2017-11-15 MED ORDER — SODIUM CHLORIDE 0.9 % IV BOLUS
1000.0000 mL | Freq: Once | INTRAVENOUS | Status: AC
  Administered 2017-11-15: 1000 mL via INTRAVENOUS

## 2017-11-15 MED ORDER — POTASSIUM CHLORIDE 2 MEQ/ML IV SOLN
INTRAVENOUS | Status: DC
  Administered 2017-11-15 – 2017-11-18 (×4): via INTRAVENOUS
  Filled 2017-11-15 (×6): qty 1000

## 2017-11-15 MED ORDER — ONDANSETRON HCL 4 MG PO TABS: 4.0000 mg | ORAL_TABLET | Freq: Four times a day (QID) | ORAL | Status: DC | PRN

## 2017-11-15 MED ORDER — LATANOPROST 0.005 % OP SOLN
1.0000 [drp] | Freq: Every day | OPHTHALMIC | Status: DC
  Administered 2017-11-15 – 2017-11-17 (×3): 1 [drp] via OPHTHALMIC
  Filled 2017-11-15: qty 2.5

## 2017-11-15 MED ORDER — ENOXAPARIN SODIUM 40 MG/0.4ML ~~LOC~~ SOLN
30.0000 mg | SUBCUTANEOUS | Status: DC
  Administered 2017-11-15 – 2017-11-16 (×2): 30 mg via SUBCUTANEOUS
  Filled 2017-11-15 (×2): qty 0.4

## 2017-11-15 MED ORDER — ACETAMINOPHEN 325 MG PO TABS
650.0000 mg | ORAL_TABLET | Freq: Four times a day (QID) | ORAL | Status: DC | PRN
  Administered 2017-11-18: 650 mg via ORAL
  Filled 2017-11-15: qty 2

## 2017-11-15 MED ORDER — VITAMIN D3 25 MCG (1000 UNIT) PO TABS
2000.0000 [IU] | ORAL_TABLET | Freq: Every day | ORAL | Status: DC
  Administered 2017-11-16 – 2017-11-18 (×3): 2000 [IU] via ORAL
  Filled 2017-11-15 (×6): qty 2

## 2017-11-15 NOTE — ED Triage Notes (Signed)
Pt to ED via [place] from home for unwitnessed fall. Unknown how long how pt had been in the floor. Pt c/o right leg and knee pain. EMS states that pt has reported increased confusion over the past week. Pt has strong urine smell on arrival. EMS states that pt was unable to bear weight on either leg. Pt is in NAD at this time.

## 2017-11-15 NOTE — ED Provider Notes (Signed)
[facility] Emergency Department Provider Note  ____________________________________________   First MD Initiated Contact with Patient 11/15/17 1339     (approximate)  I have reviewed the triage vital signs and the nursing notes.   HISTORY  Chief Complaint Fall  Level 5 exemption history limited by the patient's clinical condition  HPI Kimberly Bartlett is a 84 y.o. female who comes to the emergency department via EMS after being found on the floor of her home.  The patient lives alone and does not know how she ended up on the floor.  She was not picking up the phone today and family became concerned so they came over and found the patient lying on the ground unable to get up.  The patient herself reports severe pain on her right leg primarily in the right hip.  She is not sure if she passed out.  She does not know when she last had food or water.  Past Medical History:  Diagnosis Date  . Breast cancer ([place])   . Dislocated shoulder, right, sequela   . Glaucoma    both eyes  . Hypertension     Patient Active Problem List   Diagnosis Date Noted  . Acute kidney injury ([place]) 11/15/2017    Past Surgical History:  Procedure Laterality Date  . ABDOMINAL HYSTERECTOMY    . BREAST SURGERY     bialteral mastectomy  . EYE SURGERY     bilateral cataracts  . hammer toe Bilateral   . heel spur Bilateral   . JOINT REPLACEMENT     bilateral knees  . MASTECTOMY     bilateral    Prior to Admission medications   Medication Sig Start Date End Date Taking? Authorizing Provider  amLODipine (NORVASC) 5 MG tablet Take 1 tablet by mouth daily. 11/07/17  Yes [provider]  bimatoprost (LUMIGAN) 0.01 % SOLN Place 1 drop into both eyes at bedtime.   Yes [provider]  Cholecalciferol (VITAMIN D3) 2000 units capsule Take 2,000 Units by mouth daily.   Yes [provider]  labetalol (NORMODYNE) 300 MG tablet Take 300 mg by mouth 2 (two) times  daily.   Yes [provider]  losartan-hydrochlorothiazide (HYZAAR) 100-25 MG tablet Take 1 tablet by mouth daily. 11/07/17  Yes [provider]  torsemide (DEMADEX) 10 MG tablet Take 10 mg by mouth daily.   Yes [provider]    Allergies Patient has no known allergies.  Family History  Problem Relation Age of Onset  . Hypertension Mother     Social History Social History   Tobacco Use  . Smoking status: Former Smoker    Types: Cigarettes    Last attempt to quit: 11/05/1948    Years since quitting: 69.0  . Smokeless tobacco: Never Used  Substance Use Topics  . Alcohol use: Yes    Comment: occassional wine  . Drug use: No    Review of Systems Level 5 exemption history limited by the patient's clinical condition  ____________________________________________   PHYSICAL EXAM:  VITAL SIGNS: ED Triage Vitals  Enc Vitals Group     BP      Pulse      Resp      Temp      Temp src      SpO2      Weight      Height      Head Circumference      Peak Flow  Pain Score      Pain Loc      Pain Edu?      Excl. in [place]?     Constitutional: Alert and oriented x1 to her name.  Profoundly confused Eyes: PERRL EOMI. midrange and brisk Head: Atraumatic. Nose: No congestion/rhinnorhea. Mouth/Throat: No trismus Neck: No stridor.   Cardiovascular: Normal rate, regular rhythm. Grossly normal heart sounds.  Good peripheral circulation. Respiratory: Normal respiratory effort.  No retractions. Lungs CTAB and moving good air Gastrointestinal: Soft nontender Musculoskeletal: Right leg internally rotated with extreme discomfort when ranging Neurologic: Moves all 4 Skin:  Skin is warm, dry and intact. No rash noted. Psychiatric: Quite confused   ____________________________________________   DIFFERENTIAL includes but not limited to  Hip fracture, hip dislocation, dehydration, syncope, mechanical fall,  rhabdomyolysis ____________________________________________   LABS (all labs ordered are listed, but only abnormal results are displayed)  Labs Reviewed  COMPREHENSIVE METABOLIC PANEL - Abnormal; Notable for the following components:      Result Value   Potassium 3.0 (*)    Chloride 95 (*)    Glucose, Bld 118 (*)    BUN 76 (*)    Creatinine, Ser 2.02 (*)    Calcium 10.6 (*)    AST 159 (*)    ALT 78 (*)    GFR calc non Af Amer 21 (*)    GFR calc Af Amer 24 (*)    All other components within normal limits  TROPONIN I - Abnormal; Notable for the following components:   Troponin I 0.04 (*)    All other components within normal limits  CBC WITH DIFFERENTIAL/PLATELET - Abnormal; Notable for the following components:   WBC 11.4 (*)    Neutro Abs 10.4 (*)    Lymphs Abs 0.4 (*)    All other components within normal limits  CK - Abnormal; Notable for the following components:   Total CK 10,635 (*)    All other components within normal limits  URINE CULTURE  ETHANOL  AMMONIA  URINALYSIS, COMPLETE (UACMP) WITH MICROSCOPIC  CK  MAGNESIUM  RPR  TSH    Lab work reviewed by me with significantly elevated CK and acute kidney injury consistent with rhabdomyolysis __________________________________________  EKG  ED ECG REPORT I, Darel Hong, the attending physician, personally viewed and interpreted this ECG.  Date: 11/15/2017 EKG Time:  Rate: 59 Rhythm: Sinus bradycardia QRS Axis: Leftward axis Intervals: normal ST/T Wave abnormalities: normal Narrative Interpretation: no evidence of acute ischemia  ____________________________________________  RADIOLOGY  Head CT reviewed by me with no acute disease Pelvis CT reviewed by me with no acute disease Chest x-ray reviewed by me with no acute disease ____________________________________________   PROCEDURES  Procedure(s) performed: no  .Critical Care Performed by: Darel Hong, MD Authorized by: Darel Hong,  MD   Critical care provider statement:    Critical care time (minutes):  30   Critical care time was exclusive of:  Separately billable procedures and treating other patients   Critical care was necessary to treat or prevent imminent or life-threatening deterioration of the following conditions:  Renal failure   Critical care was time spent personally by me on the following activities:  Development of treatment plan with patient or surrogate, discussions with consultants, evaluation of patient's response to treatment, examination of patient, obtaining history from patient or surrogate, ordering and performing treatments and interventions, ordering and review of laboratory studies, ordering and review of radiographic studies, pulse oximetry, re-evaluation of patient's condition and review of old  charts    Critical Care performed: Yes  Observation: no ____________________________________________   INITIAL IMPRESSION / ASSESSMENT AND PLAN / ED COURSE  Pertinent labs & imaging results that were available during my care of the patient were reviewed by me and considered in my medical decision making (see chart for details).  The patient arrives quite confused after being found down in her home for an unclear period of time.  Differential is broad but includes intracerebral hemorrhage, stroke, metabolic derangement, fracture, and rhabdomyolysis.  Head CT along with x-ray of her right hip and chest x-ray and broad labs are pending.  We will begin with hydration for now.  The patient's hip x-ray is negative so I will add on a CT scan to evaluate for occult fracture.  Fortunately the patient's imaging is negative.  She does have acute kidney injury along with a CK of 10,000 consistent with rhabdomyolysis.  She is likewise 93 degrees core temperature so she is been placed on a bear hugger.  At this point given her multiple metabolic abnormalities she requires inpatient admission for continued fluid  resuscitation and treatment of her rhabdomyolysis and acute kidney injury.  Family understands and agrees with the plan.  I then discussed with the hospitalist who has graciously agreed to admit the patient to his service.      ____________________________________________   FINAL CLINICAL IMPRESSION(S) / ED DIAGNOSES  Final diagnoses:  Hypothermia, initial encounter  Non-traumatic rhabdomyolysis      NEW MEDICATIONS STARTED DURING THIS VISIT:  New Prescriptions   No medications on file     Note:  This document was prepared using Dragon voice recognition software and may include unintentional dictation errors.     Darel Hong, MD 11/15/17 [phone]

## 2017-11-15 NOTE — ED Notes (Signed)
Dr. Mable Paris informed of pts core temp and unsuccessful attempt to in and out cath pt. Per Dr. Mable Paris hold on attempting in and out again and applying warming methods until after pt has x-rays.

## 2017-11-15 NOTE — H&P (Signed)
[facility] NAME: Kimberly Bartlett    MR#:  [id number]  DATE OF BIRTH:  Apr 15, 1928  DATE OF ADMISSION:  11/15/2017  PRIMARY CARE PHYSICIAN: Kirk Ruths, MD   REQUESTING/REFERRING PHYSICIAN: Dr Darel Hong  CHIEF COMPLAINT:   Chief Complaint  Patient presents with  . Fall    HISTORY OF PRESENT ILLNESS:  Kimberly Bartlett  is a 84 y.o. female presented after a fall.  She was found to be hypothermic and acute kidney injury in the ER.  Family states that she complains of pain all over.  Patient is not the best historian and is tangential with her answers.  She stated to me that she is not having pain.  When I moved her legs she was complaining of some pain in the thighs.  Daughter states that she got out of her chair and slid on the ground not sure if it was a fall and could not get her up.  Hospitalist services were contacted for further evaluation.  Family also concerned about a cognitive decline that has happened over the past year worse over the last couple days.  She has been shopping a lot.  PAST MEDICAL HISTORY:   Past Medical History:  Diagnosis Date  . Breast cancer ([place])   . Dislocated shoulder, right, sequela   . Glaucoma    both eyes  . Hypertension     PAST SURGICAL HISTORY:   Past Surgical History:  Procedure Laterality Date  . ABDOMINAL HYSTERECTOMY    . BREAST SURGERY     bialteral mastectomy  . EYE SURGERY     bilateral cataracts  . hammer toe Bilateral   . heel spur Bilateral   . JOINT REPLACEMENT     bilateral knees  . MASTECTOMY     bilateral    SOCIAL HISTORY:   Social History   Tobacco Use  . Smoking status: Former Smoker    Types: Cigarettes    Last attempt to quit: 11/05/1948    Years since quitting: 69.0  . Smokeless tobacco: Never Used  Substance Use Topics  . Alcohol use: Yes    Comment: occassional wine    FAMILY HISTORY:   Family History  Problem Relation Age of  Onset  . Hypertension Mother     DRUG ALLERGIES:  No Known Allergies  REVIEW OF SYSTEMS:  CONSTITUTIONAL: No fever, chills or sweats.  Positive for weight loss.  Positive for weakness. EYES: No blurred or double vision.  EARS, NOSE, AND THROAT: No tinnitus or ear pain. No sore throat.  Decreased hearing.  Positive for runny nose. RESPIRATORY: No cough, shortness of breath, wheezing or hemoptysis.  CARDIOVASCULAR: No chest pain, orthopnea, edema.  GASTROINTESTINAL: No nausea, vomiting, diarrhea or abdominal pain. No blood in bowel movements GENITOURINARY: No dysuria, hematuria.  ENDOCRINE: No polyuria, nocturia,  HEMATOLOGY: No anemia, easy bruising or bleeding SKIN: No rash or lesion. MUSCULOSKELETAL: Some knee and thigh pain and calf pain NEUROLOGIC: No tingling, numbness, weakness.  PSYCHIATRY: No anxiety or depression.   MEDICATIONS AT HOME:   Prior to Admission medications   Medication Sig Start Date End Date Taking? Authorizing Provider  amLODipine (NORVASC) 5 MG tablet Take 1 tablet by mouth daily. 11/07/17  Yes [provider]  bimatoprost (LUMIGAN) 0.01 % SOLN Place 1 drop into both eyes at bedtime.   Yes [provider]  Cholecalciferol (VITAMIN D3) 2000 units capsule Take 2,000 Units by mouth daily.  Yes [provider]  labetalol (NORMODYNE) 300 MG tablet Take 300 mg by mouth 2 (two) times daily.   Yes [provider]  losartan-hydrochlorothiazide (HYZAAR) 100-25 MG tablet Take 1 tablet by mouth daily. 11/07/17  Yes [provider]  torsemide (DEMADEX) 10 MG tablet Take 10 mg by mouth daily.   Yes [provider]      VITAL SIGNS:  Blood pressure (!) 157/75, pulse (!) 57, temperature (!) 93.8 F (34.3 C), temperature source Rectal, resp. rate 10, SpO2 97 %.  PHYSICAL EXAMINATION:  GENERAL:  84 y.o.-year-old patient lying in the bed with no acute distress.  EYES: Pupils equal, round, reactive to light and  accommodation. No scleral icterus. Extraocular muscles intact.  HEENT: Head atraumatic, normocephalic. Oropharynx and nasopharynx clear.  NECK:  Supple, no jugular venous distention. No thyroid enlargement, no tenderness.  LUNGS: Normal breath sounds bilaterally, no wheezing, rales,rhonchi or crepitation. No use of accessory muscles of respiration.  CARDIOVASCULAR: S1, S2 normal. No murmurs, rubs, or gallops.  ABDOMEN: Soft, nontender, nondistended. Bowel sounds present. No organomegaly or mass.  EXTREMITIES: 2+ pedal edema, no cyanosis, or clubbing.  NEUROLOGIC: Cranial nerves II through XII are intact.  Patient unable to straight leg raise at this time. PSYCHIATRIC: The patient is alert and answers some yes or no questions questions but is tangential with the answers  That were not yes/no questions SKIN: No rash, lesion, or ulcer.   LABORATORY PANEL:   CBC Recent Labs  Lab 11/15/17 1409  WBC 11.4*  HGB 13.5  HCT 39.7  PLT 329   ------------------------------------------------------------------------------------------------------------------  Chemistries  Recent Labs  Lab 11/15/17 1409  NA 135  K 3.0*  CL 95*  CO2 26  GLUCOSE 118*  BUN 76*  CREATININE 2.02*  CALCIUM 10.6*  AST 159*  ALT 78*  ALKPHOS 71  BILITOT 0.7   ------------------------------------------------------------------------------------------------------------------  Cardiac Enzymes Recent Labs  Lab 11/15/17 1409  TROPONINI 0.04*   ------------------------------------------------------------------------------------------------------------------  RADIOLOGY:  Ct Head Wo Contrast  Result Date: 11/15/2017 CLINICAL DATA:  Unwitnessed fall.  Confusion. EXAM: CT HEAD WITHOUT CONTRAST TECHNIQUE: Contiguous axial images were obtained from the base of the skull through the vertex without intravenous contrast. COMPARISON:  None. FINDINGS: Brain: There is mild diffuse atrophy. There is no intracranial mass,  hemorrhage, extra-axial fluid collection, or midline shift. There is patchy small vessel disease in the centra semiovale bilaterally. Elsewhere gray-white compartments appear unremarkable. No evident acute infarct. Vascular: There is no appreciable hyperdense vessel. There is no appreciable vascular calcification. Skull: Bony calvarium appears intact. Sinuses/Orbits: There is a bony defect in the medial orbital wall on the right. Visualized paranasal sinuses are clear. Visualized orbits appear symmetric bilaterally. Other: Visualized mastoid air cells are clear. IMPRESSION: Atrophy with patchy supratentorial small vessel disease. No acute infarct. Bony defect in the medial right orbital wall, a finding that may represent either congenital variant or residua of old trauma. Electronically Signed   By: Lowella Grip III M.D.   On: 11/15/2017 15:35   Ct Pelvis Wo Contrast  Result Date: 11/15/2017 CLINICAL DATA:  Right hip pain after falling today. EXAM: CT PELVIS WITHOUT CONTRAST TECHNIQUE: Multidetector CT imaging of the pelvis was performed following the standard protocol without intravenous contrast. COMPARISON:  Radiographs earlier the same date. Right hip MRI 09/27/2004. FINDINGS: Urinary Tract: The bladder is distended with a diverticulum superiorly on the left. No bladder wall thickening or surrounding inflammation identified. No evidence of ureteral dilatation. Bowel: There are diverticular changes  throughout the sigmoid colon without wall thickening or surrounding inflammation. The appendix appears normal. Vascular/Lymphatic: Mild aortoiliac atherosclerosis. No enlarged pelvic lymph nodes are seen. Reproductive: Hysterectomy. No evidence of adnexal mass. Probable pelvic floor laxity with suspected vaginal prolapse. Other:  No pelvic ascites or free air. Musculoskeletal: There is no evidence of acute fracture or dislocation. There is nonspecific generalized sclerosis in both femoral heads. This is not  crescentic or subchondral, and there is no subchondral collapse. There are mild degenerative changes at both hips. Osteitis pubis and mild sacroiliac degenerative changes are also present. There is more advanced spondylosis in the lower lumbar spine with disc degeneration and facet hypertrophy. There is a grade 1 anterolisthesis at L5-S1 which contributes to biforaminal narrowing. No evidence of significant soft tissue hematoma. No large hip joint effusion or periarticular fluid collection. IMPRESSION: 1. No evidence of acute fracture or dislocation. 2. Moderate degenerative changes in the lower lumbar spine. At L5-S1, there is a grade 1 anterolisthesis with biforaminal narrowing. 3. Nonspecific sclerosis in both femoral heads. This is atypical for avascular necrosis and favored to be incidental. 4. Incidental findings including bladder diverticulum, sigmoid colon diverticular changes, aortoiliac atherosclerosis and probable pelvic laxity. Electronically Signed   By: Richardean Sale M.D.   On: 11/15/2017 15:41   Dg Chest Port 1 View  Result Date: 11/15/2017 CLINICAL DATA:  Pain following fall EXAM: PORTABLE CHEST 1 VIEW COMPARISON:  October 01, 2005 FINDINGS: Lungs are clear. Heart is upper normal size with pulmonary vascularity. No adenopathy. There is aortic atherosclerosis. There is degenerative change in each shoulder. No pneumothorax. IMPRESSION: No edema or consolidation. Aortic atherosclerosis. No pneumothorax. Aortic Atherosclerosis (ICD10-I70.0). Electronically Signed   By: Lowella Grip III M.D.   On: 11/15/2017 15:31   Dg Hip Unilat With Pelvis 2-3 Views Right  Result Date: 11/15/2017 CLINICAL DATA:  Right hip pain after falling today. EXAM: DG HIP (WITH OR WITHOUT PELVIS) 2-3V RIGHT COMPARISON:  None. FINDINGS: The mineralization and alignment are normal. There is no evidence of acute fracture or dislocation. Mild degenerative changes are present in the lower lumbar spine, sacroiliac joints and  both hips. There is nonspecific sclerosis of both femoral heads without subchondral collapse. IMPRESSION: No evidence of acute fracture or dislocation. Nonspecific sclerosis of the femoral heads. See separate CT pelvic exam. Electronically Signed   By: Richardean Sale M.D.   On: 11/15/2017 15:32    EKG:   Sinus rhythm 59 bpm, left anterior fascicular block, LVH  IMPRESSION AND PLAN:   1.  Acute kidney injury.  Gentle IV fluid hydration, hold probenecid colchicine, torsemide and losartan hydrochlorothiazide. 2.  Hypokalemia replace potassium and IV fluid 3.  Fall and weakness.  Get physical therapy evaluation  4.  Essential hypertension continue Norvasc and Lower the dose of labetalol 300 mg twice daily. 5.  History of gout hold off on probenecid colchicine combination at this point. 6.  Cognitive decline.  CT scan of the head unrevealing.  Send off RPR, vitamin B12 and TSH.  Looks like an outpatient setting the B12 is low and the patient also is set up for B12 injection in an July.  Send off urine analysis and urine culture to rule out infection. 7.  Hypothermia.  Unclear etiology.  Warming blanket.  Rule out infection with urine analysis and urine culture. 8.  Hypercalcemia send off a PTH, PTH related protein, SPEP, vitamin D level.   All the records are reviewed and case discussed with ED provider. Management  plans discussed with the patient, family and they are in agreement.  CODE STATUS: Full code  TOTAL TIME TAKING CARE OF THIS PATIENT: 50 minutes.    Loletha Grayer M.D on 11/15/2017 at 4:30 PM  Between 7am to 6pm - Pager - [phone]  After 6pm call admission pager [phone]  [facility]  [phone]  CC: Primary care physician; Kirk Ruths, MD

## 2017-11-15 NOTE — Progress Notes (Signed)
Patient ID: Kimberly Bartlett, female   DOB: May 18, 1928, 84 y.o.   MRN: [id number]  ACP note  Patient and daughter and niece at the bedside  Diagnosis: Acute kidney injury, hypokalemia, fall and weakness, essential hypertension, history of gout, cognitive decline, hypothermia, hypercalcemia  CODE STATUS discussed at length.  I am not sure if the patient was able to answer the question completely.  Family stated that she will be a full code.  Plan: IV fluid hydration for acute kidney injury hold medications that can cause worsening of kidney function.  Replace potassium.  Physical therapy evaluation.    Dr. Loletha Grayer Time spent on ACP discussion 17 minutes

## 2017-11-16 ENCOUNTER — Inpatient Hospital Stay: Payer: Medicare Other

## 2017-11-16 LAB — BASIC METABOLIC PANEL
Anion gap: 12 (ref 5–15)
BUN: 79 mg/dL — ABNORMAL HIGH (ref 6–20)
CO2: 22 mmol/L (ref 22–32)
CREATININE: 1.56 mg/dL — AB (ref 0.44–1.00)
Calcium: 9.6 mg/dL (ref 8.9–10.3)
Chloride: 103 mmol/L (ref 101–111)
GFR, EST AFRICAN AMERICAN: 33 mL/min — AB (ref >60)
GFR, EST NON AFRICAN AMERICAN: 29 mL/min — AB (ref >60)
Glucose, Bld: 153 mg/dL — ABNORMAL HIGH (ref 65–99)
Potassium: 3.3 mmol/L — ABNORMAL LOW (ref 3.5–5.1)
SODIUM: 137 mmol/L (ref 135–145)

## 2017-11-16 LAB — URINE CULTURE: Special Requests: NORMAL

## 2017-11-16 LAB — CK: CK TOTAL: 11802 U/L — AB (ref 38–234)

## 2017-11-16 LAB — CBC
HCT: 37.8 % (ref 35.0–47.0)
HEMOGLOBIN: 13 g/dL (ref 12.0–16.0)
MCH: 33.5 pg (ref 26.0–34.0)
MCHC: 34.2 g/dL (ref 32.0–36.0)
MCV: 97.9 fL (ref 80.0–100.0)
PLATELETS: 344 10*3/uL (ref 150–440)
RBC: 3.86 MIL/uL (ref 3.80–5.20)
RDW: 13.9 % (ref 11.5–14.5)
WBC: 13.2 10*3/uL — ABNORMAL HIGH (ref 3.6–11.0)

## 2017-11-16 LAB — RPR: RPR: NONREACTIVE

## 2017-11-16 NOTE — Plan of Care (Signed)

## 2017-11-16 NOTE — Progress Notes (Signed)
Patient resting in bed. IV fluids infusing. No acute distress noted. Continue to monitor.

## 2017-11-16 NOTE — Progress Notes (Addendum)
[facility] [place] NAME: Kimberly Bartlett    MR#:  [id number]  DATE OF BIRTH:  12-12-1927  SUBJECTIVE: Admitted yesterday for fall and found to have acute kidney injury, hypothermia.  Patient is not best historian, when she came history obtained from patient's daughter.  CHIEF COMPLAINT:   Chief Complaint  Patient presents with  . Fall  Patient is resting at this time.   REVIEW OF SYSTEMS:   Review of Systems  Unable to perform ROS: Medical condition   .   DRUG ALLERGIES:  No Known Allergies  VITALS:  Blood pressure (!) 136/93, pulse 71, temperature 98 F (36.7 C), temperature source Oral, resp. rate 16, height 5\' 3"  (1.6 m), weight 70.6 kg (155 lb 10.3 oz), SpO2 (!) 63 %.  PHYSICAL EXAMINATION:  GENERAL:  84 y.o.-year-old patient lying in the bed with no acute distress.  EYES: Pupils equal, round, reactive to light and . No scleral icterus.  HEENT: Head atraumatic, normocephalic. Oropharynx and nasopharynx clear.  NECK:  Supple, no jugular venous distention. No thyroid enlargement, LUNGS: Normal breath sounds bilaterally, no wheezing, rales,rhonchi or crepitation. No use of accessory muscles of respiration.  CARDIOVASCULAR: S1, S2 normal. No murmurs, rubs, or gallops.  ABDOMEN: Soft, nontender, nondistended. Bowel sounds present. No organomegaly or mass.  EXTREMITIES: No pedal edema, cyanosis, or clubbing.  NEUROLOGIC: Patient not able to follow commands for full neurological exam.  Due to her baseline dementia.  No facial droop. Extremities are not flaccid. PSYCHIATRIC: baselineDementia. SKIN: No obvious rash, lesion, or ulcer.    LABORATORY PANEL:   CBC Recent Labs  Lab 11/15/17 1409  WBC 11.4*  HGB 13.5  HCT 39.7  PLT 329   ------------------------------------------------------------------------------------------------------------------  Chemistries  Recent Labs  Lab 11/15/17 1409  NA 135  K 3.0*  CL 95*   CO2 26  GLUCOSE 118*  BUN 76*  CREATININE 2.02*  CALCIUM 10.6*  MG 2.4  AST 159*  ALT 78*  ALKPHOS 71  BILITOT 0.7   ------------------------------------------------------------------------------------------------------------------  Cardiac Enzymes Recent Labs  Lab 11/15/17 1409  TROPONINI 0.04*   ------------------------------------------------------------------------------------------------------------------  RADIOLOGY:  Ct Head Wo Contrast  Result Date: 11/15/2017 CLINICAL DATA:  Unwitnessed fall.  Confusion. EXAM: CT HEAD WITHOUT CONTRAST TECHNIQUE: Contiguous axial images were obtained from the base of the skull through the vertex without intravenous contrast. COMPARISON:  None. FINDINGS: Brain: There is mild diffuse atrophy. There is no intracranial mass, hemorrhage, extra-axial fluid collection, or midline shift. There is patchy small vessel disease in the centra semiovale bilaterally. Elsewhere gray-white compartments appear unremarkable. No evident acute infarct. Vascular: There is no appreciable hyperdense vessel. There is no appreciable vascular calcification. Skull: Bony calvarium appears intact. Sinuses/Orbits: There is a bony defect in the medial orbital wall on the right. Visualized paranasal sinuses are clear. Visualized orbits appear symmetric bilaterally. Other: Visualized mastoid air cells are clear. IMPRESSION: Atrophy with patchy supratentorial small vessel disease. No acute infarct. Bony defect in the medial right orbital wall, a finding that may represent either congenital variant or residua of old trauma. Electronically Signed   By: Lowella Grip III M.D.   On: 11/15/2017 15:35   Ct Pelvis Wo Contrast  Result Date: 11/15/2017 CLINICAL DATA:  Right hip pain after falling today. EXAM: CT PELVIS WITHOUT CONTRAST TECHNIQUE: Multidetector CT imaging of the pelvis was performed following the standard protocol without intravenous contrast. COMPARISON:  Radiographs  earlier the same date. Right hip MRI 09/27/2004. FINDINGS:  Urinary Tract: The bladder is distended with a diverticulum superiorly on the left. No bladder wall thickening or surrounding inflammation identified. No evidence of ureteral dilatation. Bowel: There are diverticular changes throughout the sigmoid colon without wall thickening or surrounding inflammation. The appendix appears normal. Vascular/Lymphatic: Mild aortoiliac atherosclerosis. No enlarged pelvic lymph nodes are seen. Reproductive: Hysterectomy. No evidence of adnexal mass. Probable pelvic floor laxity with suspected vaginal prolapse. Other:  No pelvic ascites or free air. Musculoskeletal: There is no evidence of acute fracture or dislocation. There is nonspecific generalized sclerosis in both femoral heads. This is not crescentic or subchondral, and there is no subchondral collapse. There are mild degenerative changes at both hips. Osteitis pubis and mild sacroiliac degenerative changes are also present. There is more advanced spondylosis in the lower lumbar spine with disc degeneration and facet hypertrophy. There is a grade 1 anterolisthesis at L5-S1 which contributes to biforaminal narrowing. No evidence of significant soft tissue hematoma. No large hip joint effusion or periarticular fluid collection. IMPRESSION: 1. No evidence of acute fracture or dislocation. 2. Moderate degenerative changes in the lower lumbar spine. At L5-S1, there is a grade 1 anterolisthesis with biforaminal narrowing. 3. Nonspecific sclerosis in both femoral heads. This is atypical for avascular necrosis and favored to be incidental. 4. Incidental findings including bladder diverticulum, sigmoid colon diverticular changes, aortoiliac atherosclerosis and probable pelvic laxity. Electronically Signed   By: Richardean Sale M.D.   On: 11/15/2017 15:41   Dg Chest Port 1 View  Result Date: 11/15/2017 CLINICAL DATA:  Pain following fall EXAM: PORTABLE CHEST 1 VIEW COMPARISON:   October 01, 2005 FINDINGS: Lungs are clear. Heart is upper normal size with pulmonary vascularity. No adenopathy. There is aortic atherosclerosis. There is degenerative change in each shoulder. No pneumothorax. IMPRESSION: No edema or consolidation. Aortic atherosclerosis. No pneumothorax. Aortic Atherosclerosis (ICD10-I70.0). Electronically Signed   By: Lowella Grip III M.D.   On: 11/15/2017 15:31   Dg Hip Unilat With Pelvis 2-3 Views Right  Result Date: 11/15/2017 CLINICAL DATA:  Right hip pain after falling today. EXAM: DG HIP (WITH OR WITHOUT PELVIS) 2-3V RIGHT COMPARISON:  None. FINDINGS: The mineralization and alignment are normal. There is no evidence of acute fracture or dislocation. Mild degenerative changes are present in the lower lumbar spine, sacroiliac joints and both hips. There is nonspecific sclerosis of both femoral heads without subchondral collapse. IMPRESSION: No evidence of acute fracture or dislocation. Nonspecific sclerosis of the femoral heads. See separate CT pelvic exam. Electronically Signed   By: Richardean Sale M.D.   On: 11/15/2017 15:32    EKG:   Orders placed or performed during the hospital encounter of 11/15/17  . ED EKG  . ED EKG  . EKG 12-Lead  . EKG 12-Lead    ASSESSMENT AND PLAN:   84 year old female patient history of hypertension, hyperlipidemia, severe obesity came in because of fall, acute renal failure, hypothermia  #1 acute renal failure likely secondary to rhabdomyolysis from fall: Continue gentle hydration, get physical therapy evaluation Hold probenecid due to acute renal failure.  2.  Acute rhabdomyolysis with elevated CK: Continue IV fluids, hold torsemide, losartan.  Secondary to acute renal failure. #3 mild hypothermia, please watch for occult sepsis, so far chest x-ray has been negative for pneumonia, UA did not show infection.  Her temperature is 98 Fahrenheit today she was 93.68F rectally when she came.   #3 hypokalemia: Replace  potassium and IV fluids, recheck the labs today including CK. 4.  Essential  hypertension: Controlled continue amlodipine, labetalol.  Watch for heart rate.  Patient had slight bradycardia in the emergency room but today heart rate is stable around 71. 5.LE Pain;Venous sono ordered but not done y et.   All the records are reviewed and case discussed with Care Management/Social Workerr. Management plans discussed with the patient, family and they are in agreement.  CODE STATUS: FULL  TOTAL TIME TAKING CARE OF THIS PATIENT: 19minutes.   POSSIBLE D/C IN 1-2DAYS, DEPENDING ON CLINICAL CONDITION.   Epifanio Lesches M.D on 11/16/2017 at 7:37 AM  Between 7am to 6pm - Pager - [phone]  After 6pm go to [email or web address] - password [email or web address] [facility]  [facility]  Office  [phone]  CC: Primary care physician; Kirk Ruths, MD   Note: This dictation was prepared with Dragon dictation along with smaller phrase technology. Any transcriptional errors that result from this process are unintentional.

## 2017-11-17 ENCOUNTER — Inpatient Hospital Stay: Payer: Medicare Other

## 2017-11-17 LAB — BASIC METABOLIC PANEL
Anion gap: 10 (ref 5–15)
BUN: 67 mg/dL — ABNORMAL HIGH (ref 6–20)
CALCIUM: 9.6 mg/dL (ref 8.9–10.3)
CHLORIDE: 104 mmol/L (ref 101–111)
CO2: 24 mmol/L (ref 22–32)
CREATININE: 1.15 mg/dL — AB (ref 0.44–1.00)
GFR calc Af Amer: 48 mL/min — ABNORMAL LOW (ref >60)
GFR calc non Af Amer: 42 mL/min — ABNORMAL LOW (ref >60)
GLUCOSE: 121 mg/dL — AB (ref 65–99)
Potassium: 3.2 mmol/L — ABNORMAL LOW (ref 3.5–5.1)
Sodium: 138 mmol/L (ref 135–145)

## 2017-11-17 LAB — CK: Total CK: 5239 U/L — ABNORMAL HIGH (ref 38–234)

## 2017-11-17 LAB — PARATHYROID HORMONE, INTACT (NO CA): PTH: 88 pg/mL — ABNORMAL HIGH (ref 15–65)

## 2017-11-17 MED ORDER — LABETALOL HCL 100 MG PO TABS
100.0000 mg | ORAL_TABLET | Freq: Two times a day (BID) | ORAL | Status: DC
  Filled 2017-11-17: qty 1

## 2017-11-17 MED ORDER — ENOXAPARIN SODIUM 40 MG/0.4ML ~~LOC~~ SOLN
40.0000 mg | SUBCUTANEOUS | Status: DC
  Administered 2017-11-17: 40 mg via SUBCUTANEOUS
  Filled 2017-11-17: qty 0.4

## 2017-11-17 MED ORDER — HYDROCHLOROTHIAZIDE 25 MG PO TABS
25.0000 mg | ORAL_TABLET | Freq: Every day | ORAL | Status: DC
  Filled 2017-11-17 (×2): qty 1

## 2017-11-17 MED ORDER — LOSARTAN POTASSIUM-HCTZ 100-25 MG PO TABS: 1.0000 | ORAL_TABLET | Freq: Every day | ORAL | Status: DC

## 2017-11-17 MED ORDER — POTASSIUM CHLORIDE CRYS ER 20 MEQ PO TBCR
40.0000 meq | EXTENDED_RELEASE_TABLET | Freq: Once | ORAL | Status: AC
  Administered 2017-11-17: 40 meq via ORAL
  Filled 2017-11-17: qty 2

## 2017-11-17 MED ORDER — CYANOCOBALAMIN 1000 MCG/ML IJ SOLN
1000.0000 ug | Freq: Once | INTRAMUSCULAR | Status: AC
  Administered 2017-11-17: 1000 ug via SUBCUTANEOUS
  Filled 2017-11-17: qty 1

## 2017-11-17 MED ORDER — LOSARTAN POTASSIUM 50 MG PO TABS
100.0000 mg | ORAL_TABLET | Freq: Every day | ORAL | Status: DC
  Administered 2017-11-17: 100 mg via ORAL
  Filled 2017-11-17 (×2): qty 2

## 2017-11-17 NOTE — Evaluation (Signed)
Physical Therapy Evaluation Patient Details Name: Kimberly Bartlett MRN: [id number] DOB: Jun 14, 1927 Today's Date: 11/17/2017   History of Present Illness  Patient is an 84 year old female admitted after falling out of chair at home. Patient found to have acute kidney injury and hyponatremia, weakness. CT head negative, CT pelvis is negative, [place] LEs negative.      Clinical Impression  Patient is an 84 year old female who slid out of chair at home. Patient lives alone the majority of the time. Niece reports she was independent and driving prior to this. Patient is alert, however slow to respond and difficult to obtain specific information from her. Patient require mod +1 assist for supine to sit at eob with significant pain in B LEs with movement. Especially at right knee area, which is tender to palpation.   Patient required +2 assist for positioning in the bed as she was unable to scoot hips over or slide up in the bed. Patient will benefit from continued PT to address her weakness and difficulty with basic mobility skills to return to PFL. Recommend that patient be sent to STR prior to discharge home.       Follow Up Recommendations SNF    Equipment Recommendations  Rolling walker with 5" wheels    Recommendations for Other Services       Precautions / Restrictions Precautions Precautions: None Restrictions Weight Bearing Restrictions: No      Mobility  Bed Mobility Overal bed mobility: Needs Assistance Bed Mobility: Supine to Sit;Sit to Supine     Supine to sit: Mod assist Sit to supine: Mod assist   General bed mobility comments: patient required +2 assist for positioning in supine.   Transfers                     Ambulation/Gait                Stairs            Wheelchair Mobility    Modified Rankin (Stroke Patients Only)       Balance                                             Pertinent Vitals/Pain Pain Assessment:  0-10 Pain Score: 6  Pain Location: right knee with movement, palpation, left LE more general pain with movement. Pain Intervention(s): Limited activity within patient's tolerance;Monitored during session    Omer expects to be discharged to:: Private residence Living Arrangements: Alone;Children Available Help at Discharge: Family Type of Home: House         Home Equipment: None      Prior Function Level of Independence: Independent               Hand Dominance        Extremity/Trunk Assessment   Upper Extremity Assessment Upper Extremity Assessment: Overall WFL for tasks assessed    Lower Extremity Assessment Lower Extremity Assessment: Generalized weakness;LLE deficits/detail;RLE deficits/detail RLE Deficits / Details: difficult to assess strength due to pain with movement. RLE: Unable to fully assess due to pain LLE Deficits / Details: painful with movement LLE: Unable to fully assess due to pain    Cervical / Trunk Assessment Cervical / Trunk Assessment: Kyphotic  Communication   Communication: No difficulties  Cognition Arousal/Alertness: Awake/alert Behavior During Therapy: [facility] for tasks  assessed/performed Overall Cognitive Status: Impaired/Different from baseline Area of Impairment: Memory;Following commands                       Following Commands: Follows one step commands inconsistently              General Comments General comments (skin integrity, edema, etc.): patient able to balance at eob one assisted with positioning.     Exercises     Assessment/Plan    PT Assessment Patient needs continued PT services  PT Problem List Decreased strength;Decreased mobility;Decreased activity tolerance;Decreased cognition       PT Treatment Interventions Therapeutic activities;Functional mobility training    PT Goals (Current goals can be found in the Care Plan section)  Acute Rehab PT Goals Patient Stated  Goal: to return to PFL PT Goal Formulation: With patient/family Time For Goal Achievement: 12/01/17 Potential to Achieve Goals: Fair    Frequency Min 2X/week   Barriers to discharge        Co-evaluation               AM-PAC PT "6 Clicks" Daily Activity  Outcome Measure Difficulty turning over in bed (including adjusting bedclothes, sheets and blankets)?: Unable Difficulty moving from lying on back to sitting on the side of the bed? : Unable Difficulty sitting down on and standing up from a chair with arms (e.g., wheelchair, bedside commode, etc,.)?: Unable Help needed moving to and from a bed to chair (including a wheelchair)?: Total Help needed walking in hospital room?: Total Help needed climbing 3-5 steps with a railing? : Total 6 Click Score: 6    End of Session   Activity Tolerance: Patient limited by pain;Patient limited by fatigue Patient left: in bed;with bed alarm set;with family/visitor present Nurse Communication: Mobility status PT Visit Diagnosis: Other abnormalities of gait and mobility (R26.89);Muscle weakness (generalized) (M62.81);History of falling (Z91.81)    Time: 6283-6629 PT Time Calculation (min) (ACUTE ONLY): 30 min   Charges:   PT Evaluation $PT Eval Moderate Complexity: 1 Mod     Shella Lahman, PT, GCS 11/17/17,9:44 AM

## 2017-11-17 NOTE — Progress Notes (Addendum)
.  PHARMACY NOTE:  RENAL DOSAGE ADJUSTMENT  LMWH regimen includes a mismatch between dosage and estimated renal function.  As per policy approved by the Pharmacy & Therapeutics and Medical Executive Committees, the medication dosage will be adjusted accordingly.  Current LMWH dosage:  Lovenox 30mg  daily  Indication: DVT prophylaxis  Renal Function:  Estimated Creatinine Clearance: 32.5 mL/min (A) (by C-G formula based on SCr of 1.15 mg/dL (H)). []      On intermittent HD, scheduled: []      On CRRT    Antimicrobial dosage has been changed to:  Lovenox 40mg  daily  Additional comments:   Thank you for allowing pharmacy to be a part of this patient's care.  Vallery Sa, PhamD 11/17/2017 12:55 PM

## 2017-11-17 NOTE — Plan of Care (Signed)
  Problem: Clinical Measurements: Goal: Will remain free from infection Outcome: Progressing Note:  Remains afebrile   Problem: Pain Managment: Goal: General experience of comfort will improve Outcome: Progressing Note:  PRN medications   Problem: Clinical Measurements: Goal: Diagnostic test results will improve Outcome: Not Progressing Note:  CK 5239 K 3.2 BUN 67/1.15

## 2017-11-17 NOTE — Progress Notes (Signed)
Called hospitalist r/t HR and history of low HR today. See new orders.

## 2017-11-17 NOTE — Progress Notes (Signed)
[place] [facility] [place] NAME: Kimberly Bartlett    MR#:  [id number]  DATE OF BIRTH:  18-Oct-1927    CHIEF COMPLAINT:   Chief Complaint  Patient presents with  . Fall   patient pleasantly confused. Not oriented to place or time. Does say she is in the hospital. Right knee pain. Continues to be weak.  Patient has had confusion at home reviewing her primary care physicians notes and had neurology referral for dementia workup.  REVIEW OF SYSTEMS:   Review of Systems  Unable to perform ROS: Medical condition    DRUG ALLERGIES:  No Known Allergies  VITALS:  Blood pressure (!) 111/50, pulse (!) 48, temperature 98.1 F (36.7 C), temperature source Oral, resp. rate 17, height 5\' 3"  (1.6 m), weight 70.6 kg (155 lb 10.3 oz), SpO2 98 %.  PHYSICAL EXAMINATION:  GENERAL:  84 y.o.-year-old patient lying in the bed with no acute distress.  EYES: Pupils equal, round, reactive to light and . No scleral icterus.  HEENT: Head atraumatic, normocephalic. Oropharynx and nasopharynx clear.  NECK:  Supple, no jugular venous distention. No thyroid enlargement, LUNGS: Normal breath sounds bilaterally, no wheezing, rales,rhonchi or crepitation. No use of accessory muscles of respiration.  CARDIOVASCULAR: S1, S2 normal. No murmurs, rubs, or gallops.  ABDOMEN: Soft, nontender, nondistended. Bowel sounds present. No organomegaly or mass.  EXTREMITIES: No pedal edema, cyanosis, or clubbing. Right knee tenderness no redness or swelling NEUROLOGIC: Patient not able to follow commands for full neurological exam.  Due to her baseline dementia.  No facial droop. Extremities are not flaccid. PSYCHIATRIC: pleasantly confused SKIN: No obvious rash, lesion, or ulcer.    LABORATORY PANEL:   CBC Recent Labs  Lab 11/16/17 0858  WBC 13.2*  HGB 13.0  HCT 37.8  PLT 344    ------------------------------------------------------------------------------------------------------------------  Chemistries  Recent Labs  Lab 11/15/17 1409  11/17/17 0802  NA 135   < > 138  K 3.0*   < > 3.2*  CL 95*   < > 104  CO2 26   < > 24  GLUCOSE 118*   < > 121*  BUN 76*   < > 67*  CREATININE 2.02*   < > 1.15*  CALCIUM 10.6*   < > 9.6  MG 2.4  --   --   AST 159*  --   --   ALT 78*  --   --   ALKPHOS 71  --   --   BILITOT 0.7  --   --    < > = values in this interval not displayed.   ------------------------------------------------------------------------------------------------------------------  Cardiac Enzymes Recent Labs  Lab 11/15/17 1409  TROPONINI 0.04*   ------------------------------------------------------------------------------------------------------------------  RADIOLOGY:  Ct Head Wo Contrast  Result Date: 11/15/2017 CLINICAL DATA:  Unwitnessed fall.  Confusion. EXAM: CT HEAD WITHOUT CONTRAST TECHNIQUE: Contiguous axial images were obtained from the base of the skull through the vertex without intravenous contrast. COMPARISON:  None. FINDINGS: Brain: There is mild diffuse atrophy. There is no intracranial mass, hemorrhage, extra-axial fluid collection, or midline shift. There is patchy small vessel disease in the centra semiovale bilaterally. Elsewhere gray-white compartments appear unremarkable. No evident acute infarct. Vascular: There is no appreciable hyperdense vessel. There is no appreciable vascular calcification. Skull: Bony calvarium appears intact. Sinuses/Orbits: There is a bony defect in the medial orbital wall on the right. Visualized paranasal sinuses are clear. Visualized orbits appear symmetric bilaterally. Other: Visualized mastoid air cells are clear.  IMPRESSION: Atrophy with patchy supratentorial small vessel disease. No acute infarct. Bony defect in the medial right orbital wall, a finding that may represent either congenital variant or  residua of old trauma. Electronically Signed   By: Lowella Grip III M.D.   On: 11/15/2017 15:35   Ct Pelvis Wo Contrast  Result Date: 11/15/2017 CLINICAL DATA:  Right hip pain after falling today. EXAM: CT PELVIS WITHOUT CONTRAST TECHNIQUE: Multidetector CT imaging of the pelvis was performed following the standard protocol without intravenous contrast. COMPARISON:  Radiographs earlier the same date. Right hip MRI 09/27/2004. FINDINGS: Urinary Tract: The bladder is distended with a diverticulum superiorly on the left. No bladder wall thickening or surrounding inflammation identified. No evidence of ureteral dilatation. Bowel: There are diverticular changes throughout the sigmoid colon without wall thickening or surrounding inflammation. The appendix appears normal. Vascular/Lymphatic: Mild aortoiliac atherosclerosis. No enlarged pelvic lymph nodes are seen. Reproductive: Hysterectomy. No evidence of adnexal mass. Probable pelvic floor laxity with suspected vaginal prolapse. Other:  No pelvic ascites or free air. Musculoskeletal: There is no evidence of acute fracture or dislocation. There is nonspecific generalized sclerosis in both femoral heads. This is not crescentic or subchondral, and there is no subchondral collapse. There are mild degenerative changes at both hips. Osteitis pubis and mild sacroiliac degenerative changes are also present. There is more advanced spondylosis in the lower lumbar spine with disc degeneration and facet hypertrophy. There is a grade 1 anterolisthesis at L5-S1 which contributes to biforaminal narrowing. No evidence of significant soft tissue hematoma. No large hip joint effusion or periarticular fluid collection. IMPRESSION: 1. No evidence of acute fracture or dislocation. 2. Moderate degenerative changes in the lower lumbar spine. At L5-S1, there is a grade 1 anterolisthesis with biforaminal narrowing. 3. Nonspecific sclerosis in both femoral heads. This is atypical for  avascular necrosis and favored to be incidental. 4. Incidental findings including bladder diverticulum, sigmoid colon diverticular changes, aortoiliac atherosclerosis and probable pelvic laxity. Electronically Signed   By: Richardean Sale M.D.   On: 11/15/2017 15:41   US Venous Img Lower Bilateral  Result Date: 11/16/2017 CLINICAL DATA:  84 year old with leg edema. EXAM: BILATERAL LOWER EXTREMITY VENOUS DOPPLER ULTRASOUND TECHNIQUE: Gray-scale sonography with graded compression, as well as color Doppler and duplex ultrasound were performed to evaluate the lower extremity deep venous systems from the level of the common femoral vein and including the common femoral, femoral, profunda femoral, popliteal and calf veins including the posterior tibial, peroneal and gastrocnemius veins when visible. The superficial great saphenous vein was also interrogated. Spectral Doppler was utilized to evaluate flow at rest and with distal augmentation maneuvers in the common femoral, femoral and popliteal veins. COMPARISON:  None. FINDINGS: RIGHT LOWER EXTREMITY Common Femoral Vein: No evidence of thrombus. Normal compressibility, respiratory phasicity and response to augmentation. Saphenofemoral Junction: No evidence of thrombus. Normal compressibility and flow on color Doppler imaging. Profunda Femoral Vein: No evidence of thrombus. Normal compressibility and flow on color Doppler imaging. Femoral Vein: No evidence of thrombus. Normal compressibility, respiratory phasicity and response to augmentation. Popliteal Vein: No evidence of thrombus. Normal compressibility, respiratory phasicity and response to augmentation. Calf Veins: Visualized right deep calf veins are patent without thrombus. Other Findings:  None. LEFT LOWER EXTREMITY Common Femoral Vein: No evidence of thrombus. Normal compressibility, respiratory phasicity and response to augmentation. Saphenofemoral Junction: No evidence of thrombus. Normal compressibility and  flow on color Doppler imaging. Profunda Femoral Vein: No evidence of thrombus. Normal compressibility and flow on color  Doppler imaging. Femoral Vein: No evidence of thrombus. Normal compressibility, respiratory phasicity and response to augmentation. Popliteal Vein: No evidence of thrombus. Normal compressibility, respiratory phasicity and response to augmentation. Calf Veins: No evidence of thrombus. Normal compressibility and flow on color Doppler imaging. Other Findings:  None. IMPRESSION: No evidence of deep venous thrombosis in the lower extremities. Electronically Signed   By: Markus Daft M.D.   On: 11/16/2017 16:52   Dg Chest Port 1 View  Result Date: 11/15/2017 CLINICAL DATA:  Pain following fall EXAM: PORTABLE CHEST 1 VIEW COMPARISON:  October 01, 2005 FINDINGS: Lungs are clear. Heart is upper normal size with pulmonary vascularity. No adenopathy. There is aortic atherosclerosis. There is degenerative change in each shoulder. No pneumothorax. IMPRESSION: No edema or consolidation. Aortic atherosclerosis. No pneumothorax. Aortic Atherosclerosis (ICD10-I70.0). Electronically Signed   By: Lowella Grip III M.D.   On: 11/15/2017 15:31   Dg Knee Complete 4 Views Right  Result Date: 11/17/2017 CLINICAL DATA:  Right knee pain following fall, initial encounter EXAM: RIGHT KNEE - COMPLETE 4+ VIEW COMPARISON:  None. FINDINGS: There are changes consistent with prior right knee replacement. No evidence of loosening is seen. No fracture or dislocation is noted. No soft tissue changes are seen. IMPRESSION: Status post right knee replacement.  No acute abnormality is noted. Electronically Signed   By: Inez Catalina M.D.   On: 11/17/2017 11:39   Dg Hip Unilat With Pelvis 2-3 Views Right  Result Date: 11/15/2017 CLINICAL DATA:  Right hip pain after falling today. EXAM: DG HIP (WITH OR WITHOUT PELVIS) 2-3V RIGHT COMPARISON:  None. FINDINGS: The mineralization and alignment are normal. There is no evidence of acute  fracture or dislocation. Mild degenerative changes are present in the lower lumbar spine, sacroiliac joints and both hips. There is nonspecific sclerosis of both femoral heads without subchondral collapse. IMPRESSION: No evidence of acute fracture or dislocation. Nonspecific sclerosis of the femoral heads. See separate CT pelvic exam. Electronically Signed   By: Richardean Sale M.D.   On: 11/15/2017 15:32    EKG:   Orders placed or performed during the hospital encounter of 11/15/17  . ED EKG  . ED EKG  . EKG 12-Lead  . EKG 12-Lead    ASSESSMENT AND PLAN:   84 year old female patient history of hypertension, hyperlipidemia, severe obesity came in because of fall, acute renal failure, hypothermia  *Acute kidney injury associated with acute rhabdomyolysis from fall. Renal function is close to normal. CK levels have reduced by 50% to 5000. Continue IV fluids  *mild hypothermia on admission. Etiology unclear. No signs of infection. Resolved on its own.  *Hypokalemia. Replace orally. Repeat labs in the morning  *Hypertension. Uncontrolled. Continue amlodipine and labetalol. Will restart losartan and hydrochlorothiazide that patient takes at home  All the records are reviewed and case discussed with Care Management/Social Workerr. Management plans discussed with the patient, family and they are in agreement.  CODE STATUS: FULL  TOTAL TIME TAKING CARE OF THIS PATIENT: 35 minutes.   POSSIBLE D/C IN 1-2 DAYS, DEPENDING ON CLINICAL CONDITION.  Called and discussed with daughter over the phone. Carma Leaven.  Leia Alf Gioia Ranes M.D on 11/17/2017 at 2:54 PM  Between 7am to 6pm - Pager - [phone]  After 6pm go to [email or web address] - password [email or web address] [facility]  [facility]  Office  [phone]  CC: Primary care physician; Kirk Ruths, MD   Note: This dictation was prepared with Dragon dictation along with smaller phrase technology. Any  transcriptional errors that  result from this process are unintentional.

## 2017-11-18 LAB — BASIC METABOLIC PANEL
Anion gap: 8 (ref 5–15)
BUN: 51 mg/dL — AB (ref 6–20)
CHLORIDE: 106 mmol/L (ref 101–111)
CO2: 24 mmol/L (ref 22–32)
Calcium: 9.4 mg/dL (ref 8.9–10.3)
Creatinine, Ser: 0.85 mg/dL (ref 0.44–1.00)
GFR calc Af Amer: >60 mL/min (ref >60)
GFR calc non Af Amer: 60 mL/min — ABNORMAL LOW (ref >60)
Glucose, Bld: 104 mg/dL — ABNORMAL HIGH (ref 65–99)
POTASSIUM: 4.1 mmol/L (ref 3.5–5.1)
SODIUM: 138 mmol/L (ref 135–145)

## 2017-11-18 LAB — CK: CK TOTAL: 2161 U/L — AB (ref 38–234)

## 2017-11-18 MED ORDER — LABETALOL HCL 100 MG PO TABS: 100.0000 mg | ORAL_TABLET | Freq: Two times a day (BID) | ORAL | Status: DC

## 2017-11-18 MED ORDER — LABETALOL HCL 100 MG PO TABS
50.0000 mg | ORAL_TABLET | Freq: Two times a day (BID) | ORAL | Status: DC
  Administered 2017-11-18: 50 mg via ORAL
  Filled 2017-11-18 (×2): qty 0.5

## 2017-11-18 MED ORDER — HYDROCHLOROTHIAZIDE 25 MG PO TABS
25.0000 mg | ORAL_TABLET | Freq: Every day | ORAL | Status: DC
  Administered 2017-11-18: 25 mg via ORAL

## 2017-11-18 MED ORDER — LOSARTAN POTASSIUM 50 MG PO TABS
100.0000 mg | ORAL_TABLET | Freq: Every day | ORAL | Status: DC
  Administered 2017-11-18: 100 mg via ORAL

## 2017-11-18 NOTE — Progress Notes (Signed)
Called report to [facility] at [phone]. Provided report to assigned RN Gregary Signs. No questions at close of report. Pt transitioning care via EMS transport.

## 2017-11-18 NOTE — Discharge Instructions (Signed)
Activity as tolerated with assistance  Heart healthy diet

## 2017-11-18 NOTE — Discharge Summary (Signed)
[facility] [place] NAME: Kimberly Bartlett    MR#:  [id number]  DATE OF BIRTH:  [birth year]  DATE OF ADMISSION:  11/15/2017 ADMITTING PHYSICIAN: Loletha Grayer, MD  DATE OF DISCHARGE: 11/18/2017  PRIMARY CARE PHYSICIAN: Kirk Ruths, MD   ADMISSION DIAGNOSIS:  Leg pain [M79.606] Hypothermia, initial encounter [T68.XXXA] Non-traumatic rhabdomyolysis [M62.82]  DISCHARGE DIAGNOSIS:  Active Problems:   Acute kidney injury ([place])   SECONDARY DIAGNOSIS:   Past Medical History:  Diagnosis Date  . Breast cancer ([place])   . Dislocated shoulder, right, sequela   . Glaucoma    both eyes  . Hypertension      ADMITTING HISTORY  HISTORY OF PRESENT ILLNESS:  Kimberly Bartlett  is a 84 y.o. female presented after a fall.  She was found to be hypothermic and acute kidney injury in the ER.  Family states that she complains of pain all over.  Patient is not the best historian and is tangential with her answers.  She stated to me that she is not having pain.  When I moved her legs she was complaining of some pain in the thighs.  Daughter states that she got out of her chair and slid on the ground not sure if it was a fall and could not get her up.  Hospitalist services were contacted for further evaluation.  Family also concerned about a cognitive decline that has happened over the past year worse over the last couple days.  She has been shopping a lot.  HOSPITAL COURSE:   84 year old female patient history of hypertension, hyperlipidemia, severe obesity came in because of fall, acute renal failure, hypothermia  * Acute kidney injury associated with acute rhabdomyolysis from fall. Renal function is close to normal. CK levels have reduced to 2000.  * Mild hypothermia on admission. Etiology unclear. No signs of infection. Resolved on its own.  * Hypokalemia. Replaced and resolved  * Hypertension. Improved. On labetalol, amlodipine, losartan and  HCTZ.  * Sinus bradycardia Reduced dose of labetalol.  * Dementia OP neurology referral made by PCP Dr. Ouida Sills.  Will need SNF for PT.  CONSULTS OBTAINED:    DRUG ALLERGIES:  No Known Allergies  DISCHARGE MEDICATIONS:   Allergies as of 11/18/2017   No Known Allergies     Medication List    TAKE these medications   amLODipine 5 MG tablet Commonly known as:  NORVASC Take 1 tablet by mouth daily.   bimatoprost 0.01 % Soln Commonly known as:  LUMIGAN Place 1 drop into both eyes at bedtime.   labetalol 100 MG tablet Commonly known as:  NORMODYNE Take 1 tablet (100 mg total) by mouth 2 (two) times daily. What changed:    medication strength  how much to take   losartan-hydrochlorothiazide 100-25 MG tablet Commonly known as:  HYZAAR Take 1 tablet by mouth daily.   torsemide 10 MG tablet Commonly known as:  DEMADEX Take 10 mg by mouth daily.   Vitamin D3 2000 units capsule Take 2,000 Units by mouth daily.       Today   VITAL SIGNS:  Blood pressure (!) 163/56, pulse (!) 50, temperature 98.3 F (36.8 C), temperature source Oral, resp. rate 16, height 5\' 3"  (1.6 m), weight 70.6 kg (155 lb 10.3 oz), SpO2 100 %.  I/O:    Intake/Output Summary (Last 24 hours) at 11/18/2017 1106 Last data filed at 11/18/2017 1009 Gross per 24 hour  Intake 1254.17 ml  Output 1600  ml  Net -345.83 ml    PHYSICAL EXAMINATION:  Physical Exam  GENERAL:  84 y.o.-year-old patient lying in the bed with no acute distress.  LUNGS: Normal breath sounds bilaterally, no wheezing, rales,rhonchi or crepitation. No use of accessory muscles of respiration.  CARDIOVASCULAR: S1, S2 normal. No murmurs, rubs, or gallops.  ABDOMEN: Soft, non-tender, non-distended. Bowel sounds present. No organomegaly or mass.  NEUROLOGIC: Moves all 4 extremities. PSYCHIATRIC: The patient is alert and awake, confused SKIN: No obvious rash, lesion, or ulcer.   DATA REVIEW:   CBC Recent Labs  Lab  11/16/17 0858  WBC 13.2*  HGB 13.0  HCT 37.8  PLT 344    Chemistries  Recent Labs  Lab 11/15/17 1409  11/18/17 0614  NA 135   < > 138  K 3.0*   < > 4.1  CL 95*   < > 106  CO2 26   < > 24  GLUCOSE 118*   < > 104*  BUN 76*   < > 51*  CREATININE 2.02*   < > 0.85  CALCIUM 10.6*   < > 9.4  MG 2.4  --   --   AST 159*  --   --   ALT 78*  --   --   ALKPHOS 71  --   --   BILITOT 0.7  --   --    < > = values in this interval not displayed.    Cardiac Enzymes Recent Labs  Lab 11/15/17 1409  TROPONINI 0.04*    Microbiology Results  Results for orders placed or performed during the hospital encounter of 11/15/17  Urine Culture     Status: Abnormal   Collection Time: 11/15/17  4:28 PM  Result Value Ref Range Status   Specimen Description   Final    URINE, RANDOM Performed at [facility], [address] [place], [place] [address]    Special Requests   Final    Normal Performed at [facility], [address] [place], [place] [address]    Culture MULTIPLE SPECIES PRESENT, SUGGEST RECOLLECTION (A)  Final   Report Status 11/16/2017 FINAL  Final    RADIOLOGY:  Dg Knee Complete 4 Views Right  Result Date: 11/17/2017 CLINICAL DATA:  Right knee pain following fall, initial encounter EXAM: RIGHT KNEE - COMPLETE 4+ VIEW COMPARISON:  None. FINDINGS: There are changes consistent with prior right knee replacement. No evidence of loosening is seen. No fracture or dislocation is noted. No soft tissue changes are seen. IMPRESSION: Status post right knee replacement.  No acute abnormality is noted. Electronically Signed   By: Inez Catalina M.D.   On: 11/17/2017 11:39    Follow up with PCP in 1 week.  Management plans discussed with the patient, family and they are in agreement.  CODE STATUS:     Code Status Orders  (From admission, onward)        Start     Ordered   11/15/17 1627  Full code  Continuous     11/15/17 1626    Code Status History    This  patient has a current code status but no historical code status.      TOTAL TIME TAKING CARE OF THIS PATIENT ON DAY OF DISCHARGE: more than 30 minutes.   Neita Carp M.D on 11/18/2017 at 11:06 AM  Between 7am to 6pm - Pager - [phone]  After 6pm go to [email or web address] - [place]  [facility]  Office  [phone]  CC: Primary care physician; Kirk Ruths, MD  Note: This dictation was prepared with Dragon dictation along with smaller phrase technology. Any transcriptional errors that result from this process are unintentional.

## 2017-11-18 NOTE — Clinical Social Work Placement (Signed)
   CLINICAL SOCIAL WORK PLACEMENT  NOTE  Date:  11/18/2017  Patient Details  Name: Kimberly Bartlett MRN: [id number] Date of Birth: [birth year]  Clinical Social Work is seeking post-discharge placement for this patient at the [place] level of care (*CSW will initial, date and re-position this form in  chart as items are completed):  Yes   Patient/family provided with [facility] [facility] Work Department's list of facilities offering this level of care within the geographic area requested by the patient (or if unable, by the patient's family).  Yes   Patient/family informed of their freedom to choose among providers that offer the needed level of care, that participate in Medicare, Medicaid or managed care program needed by the patient, have an available bed and are willing to accept the patient.  Yes   Patient/family informed of [facility]'s ownership interest in [facility] and [facility], as well as of the fact that they are under no obligation to receive care at these facilities.  PASRR submitted to EDS on 11/18/17     PASRR number received on 11/18/17     Existing PASRR number confirmed on       FL2 transmitted to all facilities in geographic area requested by pt/family on 11/18/17     FL2 transmitted to all facilities within larger geographic area on       Patient informed that his/her managed care company has contracts with or will negotiate with certain facilities, including the following:        Yes   Patient/family informed of bed offers received.  Patient [facility] bed at [facility]     Physician recommends and patient chooses bed at      Patient to be transferred to [facility] on 11/18/17.  Patient to be transferred to facility by [facility]     Patient family notified on 11/18/17 of transfer.  Name of family member notified:  Patient's daughter [name] [phone]     PHYSICIAN Please  sign FL2     Additional Comment:    _______________________________________________ Ross Ludwig, LCSWA 11/18/2017, 2:40 PM

## 2017-11-18 NOTE — Progress Notes (Signed)
Pt in transfer pack. EMS notified of non emergent transport. Daughter Cyrstal Leitz notified of EMS transport pending.

## 2017-11-18 NOTE — Progress Notes (Signed)
[facility] [place] [facility] [place] NAME: Kimberly Bartlett    MR#:  [id number]  DATE OF BIRTH:  August 21, 1927    CHIEF COMPLAINT:   Chief Complaint  Patient presents with  . Fall   Confused. No complaints about pain  REVIEW OF SYSTEMS:   Review of Systems  Unable to perform ROS: Medical condition    DRUG ALLERGIES:  No Known Allergies  VITALS:  Blood pressure (!) 163/56, pulse (!) 50, temperature 98.3 F (36.8 C), temperature source Oral, resp. rate 16, height 5\' 3"  (1.6 m), weight 70.6 kg (155 lb 10.3 oz), SpO2 100 %.  PHYSICAL EXAMINATION:  GENERAL:  84 y.o.-year-old patient lying in the bed with no acute distress.  EYES: Pupils equal, round, reactive to light and . No scleral icterus.  HEENT: Head atraumatic, normocephalic. Oropharynx and nasopharynx clear.  NECK:  Supple, no jugular venous distention. No thyroid enlargement, LUNGS: Normal breath sounds bilaterally, no wheezing, rales,rhonchi or crepitation. No use of accessory muscles of respiration.  CARDIOVASCULAR: S1, S2 normal. No murmurs, rubs, or gallops.  ABDOMEN: Soft, nontender, nondistended. Bowel sounds present. No organomegaly or mass.  EXTREMITIES: No pedal edema, cyanosis, or clubbing. Right knee tenderness no redness or swelling NEUROLOGIC: Patient not able to follow commands for full neurological exam.  Due to her baseline dementia.  No facial droop. Extremities are not flaccid. PSYCHIATRIC: pleasantly confused SKIN: No obvious rash, lesion, or ulcer.    LABORATORY PANEL:   CBC Recent Labs  Lab 11/16/17 0858  WBC 13.2*  HGB 13.0  HCT 37.8  PLT 344   ------------------------------------------------------------------------------------------------------------------  Chemistries  Recent Labs  Lab 11/15/17 1409  11/18/17 0614  NA 135   < > 138  K 3.0*   < > 4.1  CL 95*   < > 106  CO2 26   < > 24  GLUCOSE 118*   < > 104*  BUN 76*   < > 51*  CREATININE 2.02*    < > 0.85  CALCIUM 10.6*   < > 9.4  MG 2.4  --   --   AST 159*  --   --   ALT 78*  --   --   ALKPHOS 71  --   --   BILITOT 0.7  --   --    < > = values in this interval not displayed.   ------------------------------------------------------------------------------------------------------------------  Cardiac Enzymes Recent Labs  Lab 11/15/17 1409  TROPONINI 0.04*   ------------------------------------------------------------------------------------------------------------------  RADIOLOGY:  Dg Knee Complete 4 Views Right  Result Date: 11/17/2017 CLINICAL DATA:  Right knee pain following fall, initial encounter EXAM: RIGHT KNEE - COMPLETE 4+ VIEW COMPARISON:  None. FINDINGS: There are changes consistent with prior right knee replacement. No evidence of loosening is seen. No fracture or dislocation is noted. No soft tissue changes are seen. IMPRESSION: Status post right knee replacement.  No acute abnormality is noted. Electronically Signed   By: Inez Catalina M.D.   On: 11/17/2017 11:39    EKG:   Orders placed or performed during the hospital encounter of 11/15/17  . ED EKG  . ED EKG  . EKG 12-Lead  . EKG 12-Lead    ASSESSMENT AND PLAN:   84 year old female patient history of hypertension, hyperlipidemia, severe obesity came in because of fall, acute renal failure, hypothermia  *Acute kidney injury associated with acute rhabdomyolysis from fall. Renal function is close to normal. CK levels have reduced to 2000  *Mild hypothermia  on admission. Etiology unclear. No signs of infection. Resolved on its own.  *Hypokalemia. Replace orally. Repeat labs in the morning  *Hypertension. Improved  * Sinus bradycardia Reduce dose of labetalol  All the records are reviewed and case discussed with Care Management/Social Workerr. Management plans discussed with the patient, family and they are in agreement.  CODE STATUS: FULL  TOTAL TIME TAKING CARE OF THIS PATIENT: 35 minutes.    POSSIBLE D/C IN 1-2 DAYS, DEPENDING ON CLINICAL CONDITION.  Called and discussed with daughter over the phone. Carma Leaven.  Neita Carp M.D on 11/18/2017 at 10:39 AM  Between 7am to 6pm - Pager - [phone]  After 6pm go to [email or web address] - password [email or web address] [facility]  [facility]  Office  [phone]  CC: Primary care physician; Kirk Ruths, MD   Note: This dictation was prepared with Dragon dictation along with smaller phrase technology. Any transcriptional errors that result from this process are unintentional.

## 2017-11-18 NOTE — Clinical Social Work Note (Addendum)
10:30am CSW contacted patient's daughter to provide bed offers, patient's daughter stated she will review options and then call CSW back with decision in a couple of hours.  12:45pm  CSW received phone call from patient's daughter with her bed choice.  Patient's daughter stated she would like [facility]  CSW contacted [facility], and they said they should be able to accept patient today, they are just trying to verify insurance.  [facility] will call CSW back.  Patient to be d/c'ed today to [facility] room E1. Patient and family agreeable to plans will transport via ems RN to call report [phone].  CSW updated patient's daughter [name] [phone].   Jones Broom. [place], MSW, [place]  11/18/2017 1:00 PM

## 2017-11-18 NOTE — Progress Notes (Signed)
EMS transport in progress. Daughter requested to be notified once EMS left with mother.  Attempted to notify daughter; received voicemail. Left message to call Cheyane Ayon at [phone].

## 2017-11-18 NOTE — NC FL2 (Signed)
[place] LEVEL OF CARE SCREENING TOOL     IDENTIFICATION  Patient Name: Kimberly Bartlett Birthdate: [birth year] Sex: female Admission Date (Current Location): 11/15/2017  [place] and [place] Number:  Engineering geologist and Address:  [facility], [address] [place], [place] [address]      Provider Number: [id number]  Attending Physician Name and Address:  Hillary Bow, MD  Relative Name and Phone Number:  Aily, Tzeng Daughter [phone]     Current Level of Care: Hospital Recommended Level of Care: [place] Prior Approval Number:    Date Approved/Denied:   PASRR Number: [id number]  Discharge Plan: SNF    Current Diagnoses: Patient Active Problem List   Diagnosis Date Noted  . Acute kidney injury ([place]) 11/15/2017    Orientation RESPIRATION BLADDER Height & Weight     Self, Time, Situation  Normal Incontinent Weight: 155 lb 10.3 oz (70.6 kg) Height:  5\' 3"  (160 cm)  BEHAVIORAL SYMPTOMS/MOOD NEUROLOGICAL BOWEL NUTRITION STATUS      Incontinent Diet(Cardiac diet)  AMBULATORY STATUS COMMUNICATION OF NEEDS Skin   Limited Assist Verbally Surgical wounds                       Personal Care Assistance Level of Assistance  Bathing, Feeding, Dressing Bathing Assistance: Limited assistance Feeding assistance: Independent Dressing Assistance: Limited assistance     Functional Limitations Info  Hearing, Speech, Sight Sight Info: Adequate Hearing Info: Adequate Speech Info: Adequate    SPECIAL CARE FACTORS FREQUENCY  PT (By licensed PT)     PT Frequency: 5x a week              Contractures Contractures Info: Not present    Additional Factors Info  Code Status, Allergies Code Status Info: Full code Allergies Info: nka           Current Medications (11/18/2017):  This is the current hospital active medication list Current Facility-Administered Medications  Medication Dose Route  Frequency Provider Last Rate Last Dose  . acetaminophen (TYLENOL) tablet 650 mg  650 mg Oral Q6H PRN Loletha Grayer, MD       Or  . acetaminophen (TYLENOL) suppository 650 mg  650 mg Rectal Q6H PRN Wieting, Richard, MD      . amLODipine (NORVASC) tablet 5 mg  5 mg Oral Daily Loletha Grayer, MD   5 mg at 11/17/17 1003  . cholecalciferol (VITAMIN D) tablet 2,000 Units  2,000 Units Oral Daily Loletha Grayer, MD   2,000 Units at 11/17/17 1003  . enoxaparin (LOVENOX) injection 40 mg  40 mg Subcutaneous Q24H Hillary Bow, MD   40 mg at 11/17/17 1952  . losartan (COZAAR) tablet 100 mg  100 mg Oral Daily Sudini, Alveta Heimlich, MD   100 mg at 11/17/17 1129   Or  . hydrochlorothiazide (HYDRODIURIL) tablet 25 mg  25 mg Oral Daily Sudini, Srikar, MD      . labetalol (NORMODYNE) tablet 50 mg  50 mg Oral BID Sudini, Srikar, MD      . lactated ringers 1,000 mL with potassium chloride 20 mEq infusion   Intravenous Continuous Loletha Grayer, MD 50 mL/hr at 11/18/17 0436    . latanoprost (XALATAN) 0.005 % ophthalmic solution 1 drop  1 drop Both Eyes QHS Loletha Grayer, MD   1 drop at 11/17/17 1951  . ondansetron (ZOFRAN) tablet 4 mg  4 mg Oral Q6H PRN Loletha Grayer, MD       Or  .  ondansetron (ZOFRAN) injection 4 mg  4 mg Intravenous Q6H PRN Loletha Grayer, MD         Discharge Medications: Please see discharge summary for a list of discharge medications.  Relevant Imaging Results:  Relevant Lab Results:   Additional Information SSN [id number]  Ross Ludwig, [place]

## 2017-11-18 NOTE — Plan of Care (Signed)

## 2017-11-18 NOTE — Care Management Important Message (Signed)
Important Message  Patient Details  Name: Kimberly Bartlett MRN: [id number] Date of Birth: March 06, 1928   Medicare Important Message Given:  Yes    Juliann Pulse A Jahmal Dunavant 11/18/2017, 1:48 PM

## 2017-11-18 NOTE — Clinical Social Work Note (Signed)
Clinical Social Work Assessment  Patient Details  Name: Kimberly Bartlett MRN: [id number] Date of Birth: 1928/05/25  Date of referral:  11/18/17               Reason for consult:  Facility Placement                Permission sought to share information with:  Family Supports, Customer service manager Permission granted to share information::  Yes, Verbal Permission Granted  Name::     Kimberly Bartlett,Kimberly Bartlett Daughter [phone]   Agency::  SNF admissions  Relationship::     Contact Information:     Housing/Transportation Living arrangements for the past 2 months:  Single Family Home Source of Information:  Adult Children, Medical Team Patient Interpreter Needed:  None Criminal Activity/Legal Involvement Pertinent to Current Situation/Hospitalization:  No - Comment as needed Significant Relationships:  Adult Children Lives with:  Adult Children Do you feel safe going back to the place where you live?  No Need for family participation in patient care:  Yes (Comment)  Care giving concerns: Patient's family feels that patient needs short term rehab before she returns back home.   Social Worker assessment / plan:  Patient is an 84 year old female who is alert and oriented x1, patient's daughter lives with patient.  Patient has some confusion, assessment was completed by speaking to patient's daughter and reviewing medical record.  Per patient's daughter, patient has not been to rehab before, CSW explained to patient how insurance will pay for stay and what to expect at SNF.  CSW explained role of CSW and process of trying to find placement at SNFs.  CSW discussed that if patient's daughter is not happy with how things are going she can speak with administration, and if they don't solve the problem, they can request to transfer.  CSW was given permission to begin bed search in [place].  Patient's daughter did not have any more questions.   Employment status:  Retired Engineer, technical sales, Managed Care PT Recommendations:  [place] / Referral to community resources:  [place]  Patient/Family's Response to care:  Patient's family is agreeable to going to SNF for short term rehab.    Patient/Family's Understanding of and Emotional Response to Diagnosis, Current Treatment, and Prognosis: Patient's family is hopeful that she will not have to be at [facility] for very long.  Emotional Assessment Appearance:  Appears stated age Attitude/Demeanor/Rapport:    Affect (typically observed):  Appropriate, Stable, Calm Orientation:  Oriented to Self Alcohol / Substance use:  Not Applicable Psych involvement (Current and /or in the community):  No (Comment)  Discharge Needs  Concerns to be addressed:  Lack of Support Readmission within the last 30 days:  No Current discharge risk:  Lack of support system Barriers to Discharge:  No Barriers Identified   Ross Ludwig, LCSWA 11/18/2017, 2:44 PM

## 2017-12-30 ENCOUNTER — Encounter: Payer: Self-pay | Admitting: Emergency Medicine

## 2017-12-30 ENCOUNTER — Inpatient Hospital Stay
Admission: EM | Admit: 2017-12-30 | Discharge: 2018-01-03 | DRG: 682 | Disposition: A | Discharge status: Home Health | Payer: Medicare Other | Attending: Internal Medicine | Admitting: Internal Medicine

## 2017-12-30 ENCOUNTER — Other Ambulatory Visit: Payer: Self-pay

## 2017-12-30 DIAGNOSIS — F0280 Dementia in other diseases classified elsewhere without behavioral disturbance: Secondary | ICD-10-CM

## 2017-12-30 DIAGNOSIS — Z8249 Family history of ischemic heart disease and other diseases of the circulatory system: Secondary | ICD-10-CM | POA: Diagnosis not present

## 2017-12-30 DIAGNOSIS — Z9842 Cataract extraction status, left eye: Secondary | ICD-10-CM | POA: Diagnosis not present

## 2017-12-30 DIAGNOSIS — R410 Disorientation, unspecified: Secondary | ICD-10-CM | POA: Insufficient documentation

## 2017-12-30 DIAGNOSIS — Z79899 Other long term (current) drug therapy: Secondary | ICD-10-CM

## 2017-12-30 DIAGNOSIS — Z972 Presence of dental prosthetic device (complete) (partial): Secondary | ICD-10-CM

## 2017-12-30 DIAGNOSIS — E878 Other disorders of electrolyte and fluid balance, not elsewhere classified: Secondary | ICD-10-CM | POA: Diagnosis present

## 2017-12-30 DIAGNOSIS — Z7189 Other specified counseling: Secondary | ICD-10-CM | POA: Diagnosis not present

## 2017-12-30 DIAGNOSIS — R627 Adult failure to thrive: Secondary | ICD-10-CM | POA: Diagnosis present

## 2017-12-30 DIAGNOSIS — L89620 Pressure ulcer of left heel, unstageable: Secondary | ICD-10-CM | POA: Diagnosis present

## 2017-12-30 DIAGNOSIS — R7989 Other specified abnormal findings of blood chemistry: Secondary | ICD-10-CM | POA: Diagnosis present

## 2017-12-30 DIAGNOSIS — E875 Hyperkalemia: Secondary | ICD-10-CM | POA: Diagnosis not present

## 2017-12-30 DIAGNOSIS — F0390 Unspecified dementia without behavioral disturbance: Secondary | ICD-10-CM | POA: Diagnosis present

## 2017-12-30 DIAGNOSIS — Z515 Encounter for palliative care: Secondary | ICD-10-CM | POA: Diagnosis not present

## 2017-12-30 DIAGNOSIS — Z6828 Body mass index (BMI) 28.0-28.9, adult: Secondary | ICD-10-CM | POA: Diagnosis not present

## 2017-12-30 DIAGNOSIS — L8990 Pressure ulcer of unspecified site, unspecified stage: Secondary | ICD-10-CM

## 2017-12-30 DIAGNOSIS — E860 Dehydration: Secondary | ICD-10-CM | POA: Diagnosis not present

## 2017-12-30 DIAGNOSIS — Z853 Personal history of malignant neoplasm of breast: Secondary | ICD-10-CM | POA: Diagnosis not present

## 2017-12-30 DIAGNOSIS — E876 Hypokalemia: Secondary | ICD-10-CM | POA: Diagnosis present

## 2017-12-30 DIAGNOSIS — Z96653 Presence of artificial knee joint, bilateral: Secondary | ICD-10-CM | POA: Diagnosis present

## 2017-12-30 DIAGNOSIS — G301 Alzheimer's disease with late onset: Secondary | ICD-10-CM

## 2017-12-30 DIAGNOSIS — Z87891 Personal history of nicotine dependence: Secondary | ICD-10-CM

## 2017-12-30 DIAGNOSIS — Z90710 Acquired absence of both cervix and uterus: Secondary | ICD-10-CM | POA: Diagnosis not present

## 2017-12-30 DIAGNOSIS — N179 Acute kidney failure, unspecified: Secondary | ICD-10-CM | POA: Diagnosis not present

## 2017-12-30 DIAGNOSIS — H409 Unspecified glaucoma: Secondary | ICD-10-CM | POA: Diagnosis present

## 2017-12-30 DIAGNOSIS — I10 Essential (primary) hypertension: Secondary | ICD-10-CM | POA: Diagnosis present

## 2017-12-30 DIAGNOSIS — Z9013 Acquired absence of bilateral breasts and nipples: Secondary | ICD-10-CM | POA: Diagnosis not present

## 2017-12-30 DIAGNOSIS — E43 Unspecified severe protein-calorie malnutrition: Secondary | ICD-10-CM

## 2017-12-30 DIAGNOSIS — Z9841 Cataract extraction status, right eye: Secondary | ICD-10-CM

## 2017-12-30 DIAGNOSIS — R32 Unspecified urinary incontinence: Secondary | ICD-10-CM | POA: Diagnosis present

## 2017-12-30 HISTORY — DX: Unspecified dementia, unspecified severity, without behavioral disturbance, psychotic disturbance, mood disturbance, and anxiety: F03.90

## 2017-12-30 LAB — TROPONIN I
Troponin I: 0.13 ng/mL (ref <0.03)
Troponin I: 0.13 ng/mL (ref <0.03)
Troponin I: 0.16 ng/mL (ref <0.03)

## 2017-12-30 LAB — TSH: TSH: 2.024 u[IU]/mL (ref 0.350–4.500)

## 2017-12-30 LAB — COMPREHENSIVE METABOLIC PANEL
ALK PHOS: 44 U/L (ref 38–126)
ALT: 13 U/L (ref 0–44)
ANION GAP: 14 (ref 5–15)
AST: 21 U/L (ref 15–41)
Albumin: 4.7 g/dL (ref 3.5–5.0)
BUN: 128 mg/dL — ABNORMAL HIGH (ref 8–23)
CALCIUM: 11.5 mg/dL — AB (ref 8.9–10.3)
CO2: 35 mmol/L — ABNORMAL HIGH (ref 22–32)
CREATININE: 2 mg/dL — AB (ref 0.44–1.00)
Chloride: 88 mmol/L — ABNORMAL LOW (ref 98–111)
GFR calc non Af Amer: 21 mL/min — ABNORMAL LOW (ref >60)
GFR, EST AFRICAN AMERICAN: 25 mL/min — AB (ref >60)
Glucose, Bld: 141 mg/dL — ABNORMAL HIGH (ref 70–99)
Potassium: 3.2 mmol/L — ABNORMAL LOW (ref 3.5–5.1)
SODIUM: 137 mmol/L (ref 135–145)
TOTAL PROTEIN: 8.6 g/dL — AB (ref 6.5–8.1)
Total Bilirubin: 1.1 mg/dL (ref 0.3–1.2)

## 2017-12-30 LAB — CBC WITH DIFFERENTIAL/PLATELET
Basophils Absolute: 0 10*3/uL (ref 0–0.1)
Basophils Relative: 0 %
EOS ABS: 0 10*3/uL (ref 0–0.7)
Eosinophils Relative: 0 %
HCT: 36.6 % (ref 35.0–47.0)
HEMOGLOBIN: 12.7 g/dL (ref 12.0–16.0)
LYMPHS ABS: 0.7 10*3/uL — AB (ref 1.0–3.6)
Lymphocytes Relative: 12 %
MCH: 33.7 pg (ref 26.0–34.0)
MCHC: 34.6 g/dL (ref 32.0–36.0)
MCV: 97.3 fL (ref 80.0–100.0)
MONOS PCT: 4 %
Monocytes Absolute: 0.2 10*3/uL (ref 0.2–0.9)
NEUTROS PCT: 84 %
Neutro Abs: 4.9 10*3/uL (ref 1.4–6.5)
Platelets: 294 10*3/uL (ref 150–440)
RBC: 3.76 MIL/uL — AB (ref 3.80–5.20)
RDW: 15.2 % — ABNORMAL HIGH (ref 11.5–14.5)
WBC: 5.9 10*3/uL (ref 3.6–11.0)

## 2017-12-30 LAB — CK: CK TOTAL: 92 U/L (ref 38–234)

## 2017-12-30 MED ORDER — COLCHICINE 0.6 MG PO TABS
0.6000 mg | ORAL_TABLET | Freq: Every day | ORAL | Status: DC
  Administered 2017-12-31 – 2018-01-03 (×4): 0.6 mg via ORAL
  Filled 2017-12-30 (×4): qty 1

## 2017-12-30 MED ORDER — SODIUM CHLORIDE 0.9 % IV BOLUS: 1000.0000 mL | Freq: Once | INTRAVENOUS | Status: DC

## 2017-12-30 MED ORDER — GABAPENTIN 100 MG PO CAPS
100.0000 mg | ORAL_CAPSULE | Freq: Two times a day (BID) | ORAL | Status: DC
  Administered 2017-12-30 – 2018-01-03 (×8): 100 mg via ORAL
  Filled 2017-12-30 (×8): qty 1

## 2017-12-30 MED ORDER — ACETAMINOPHEN 650 MG RE SUPP: 650.0000 mg | Freq: Four times a day (QID) | RECTAL | Status: DC | PRN

## 2017-12-30 MED ORDER — COLCHICINE-PROBENECID 0.5-500 MG PO TABS: 1.0000 | ORAL_TABLET | Freq: Every day | ORAL | Status: DC

## 2017-12-30 MED ORDER — POLYETHYLENE GLYCOL 3350 17 G PO PACK: 17.0000 g | PACK | Freq: Every day | ORAL | Status: DC | PRN

## 2017-12-30 MED ORDER — ONDANSETRON HCL 4 MG PO TABS: 4.0000 mg | ORAL_TABLET | Freq: Four times a day (QID) | ORAL | Status: DC | PRN

## 2017-12-30 MED ORDER — PROBENECID 500 MG PO TABS
500.0000 mg | ORAL_TABLET | Freq: Every day | ORAL | Status: DC
  Administered 2017-12-31 – 2018-01-03 (×4): 500 mg via ORAL
  Filled 2017-12-30 (×4): qty 1

## 2017-12-30 MED ORDER — POTASSIUM CHLORIDE IN NACL 20-0.9 MEQ/L-% IV SOLN
INTRAVENOUS | Status: DC
  Administered 2017-12-30 – 2018-01-01 (×5): via INTRAVENOUS
  Filled 2017-12-30 (×7): qty 1000

## 2017-12-30 MED ORDER — ALBUTEROL SULFATE (2.5 MG/3ML) 0.083% IN NEBU: 2.5000 mg | INHALATION_SOLUTION | RESPIRATORY_TRACT | Status: DC | PRN

## 2017-12-30 MED ORDER — SODIUM CHLORIDE 0.9 % IV SOLN
1000.0000 mL | Freq: Once | INTRAVENOUS | Status: AC
  Administered 2017-12-30: 1000 mL via INTRAVENOUS

## 2017-12-30 MED ORDER — AMLODIPINE BESYLATE 5 MG PO TABS
5.0000 mg | ORAL_TABLET | Freq: Every day | ORAL | Status: DC
  Administered 2018-01-01 – 2018-01-03 (×3): 5 mg via ORAL
  Filled 2017-12-30 (×3): qty 1

## 2017-12-30 MED ORDER — ACETAMINOPHEN 325 MG PO TABS
650.0000 mg | ORAL_TABLET | Freq: Four times a day (QID) | ORAL | Status: DC | PRN
  Administered 2018-01-01: 650 mg via ORAL
  Filled 2017-12-30: qty 2

## 2017-12-30 MED ORDER — HEPARIN SODIUM (PORCINE) 5000 UNIT/ML IJ SOLN
5000.0000 [IU] | Freq: Three times a day (TID) | INTRAMUSCULAR | Status: DC
  Administered 2017-12-30 – 2018-01-03 (×8): 5000 [IU] via SUBCUTANEOUS
  Filled 2017-12-30 (×9): qty 1

## 2017-12-30 MED ORDER — ONDANSETRON HCL 4 MG/2ML IJ SOLN: 4.0000 mg | Freq: Four times a day (QID) | INTRAMUSCULAR | Status: DC | PRN

## 2017-12-30 MED ORDER — LATANOPROST 0.005 % OP SOLN
1.0000 [drp] | Freq: Every day | OPHTHALMIC | Status: DC
  Administered 2017-12-30 – 2018-01-02 (×4): 1 [drp] via OPHTHALMIC
  Filled 2017-12-30 (×2): qty 2.5

## 2017-12-30 NOTE — ED Triage Notes (Signed)
Pt to ED via EMS from home, was recently sent home from rehab x1wk ago. Per family pt increased weakness and decreased PO intake since home. Pt A&OX3, unsure of baseline mentation. VSS.

## 2017-12-30 NOTE — H&P (Signed)
[facility] [place] NAME: Kimberly Bartlett    MR#:  [id number]  DATE OF BIRTH:  [birth year]  DATE OF ADMISSION:  12/30/2017  PRIMARY CARE PHYSICIAN: Kirk Ruths, MD   REQUESTING/REFERRING PHYSICIAN: Dr. Wynona Neat  CHIEF COMPLAINT:   Chief Complaint  Patient presents with  . Weakness    HISTORY OF PRESENT ILLNESS:  Kimberly Bartlett  is a 84 y.o. female with a known history of hypertension, glaucoma, dementia, breast cancer who was recently in the hospital for rhabdomyolysis, acute kidney injury returns from home with weakness, not eating and drinking well.  Daughter at bedside contributes to most of the history.  Patient is oriented to place but not time.  She is waiting for a neurology consultation for her memory problems with her first appointment being tomorrow at 1 PM. Patient tells me that she just does not feel like eating.  Feels extremely weak like she is shutting down.  Her BUN is greater than 100 and creatinine 2. Patient was in the hospital last month for rhabdomyolysis, acute kidney injury, weakness.  Treated with IV fluids with CK improving.  Labetalol dose decreased due to mild bradycardia.  This has been stopped due to hypotension by her PCP who she saw on Thursday.   PAST MEDICAL HISTORY:   Past Medical History:  Diagnosis Date  . Breast cancer ([place])   . Dementia   . Dislocated shoulder, right, sequela   . Glaucoma    both eyes  . Hypertension     PAST SURGICAL HISTORY:   Past Surgical History:  Procedure Laterality Date  . ABDOMINAL HYSTERECTOMY    . BREAST SURGERY     bialteral mastectomy  . EYE SURGERY     bilateral cataracts  . hammer toe Bilateral   . heel spur Bilateral   . JOINT REPLACEMENT     bilateral knees  . MASTECTOMY     bilateral    SOCIAL HISTORY:   Social History   Tobacco Use  . Smoking status: Former Smoker    Types: Cigarettes    Last attempt to quit: 11/05/1948    Years since quitting:  69.1  . Smokeless tobacco: Never Used  Substance Use Topics  . Alcohol use: Yes    Comment: occassional wine    FAMILY HISTORY:   Family History  Problem Relation Age of Onset  . Hypertension Mother     DRUG ALLERGIES:  No Known Allergies  REVIEW OF SYSTEMS:   Review of Systems  Constitutional: Positive for malaise/fatigue and weight loss. Negative for chills and fever.  HENT: Negative for hearing loss and nosebleeds.   Eyes: Negative for blurred vision, double vision and pain.  Respiratory: Negative for cough, hemoptysis, sputum production, shortness of breath and wheezing.   Cardiovascular: Negative for chest pain, palpitations, orthopnea and leg swelling.  Gastrointestinal: Negative for abdominal pain, constipation, diarrhea, nausea and vomiting.  Genitourinary: Negative for dysuria and hematuria.  Musculoskeletal: Negative for back pain, falls and myalgias.  Skin: Negative for rash.  Neurological: Negative for dizziness, tremors, sensory change, speech change, focal weakness, seizures and headaches.  Endo/Heme/Allergies: Does not bruise/bleed easily.  Psychiatric/Behavioral: Positive for memory loss. Negative for depression. The patient is not nervous/anxious.     MEDICATIONS AT HOME:   Prior to Admission medications   Medication Sig Start Date End Date Taking? Authorizing Provider  amLODipine (NORVASC) 5 MG tablet Take 5 mg by mouth daily.    Yes [provider]  bimatoprost (LUMIGAN) 0.01 % SOLN Place 1 drop into both eyes at bedtime.   Yes [provider]  Cholecalciferol (VITAMIN D) 2000 units tablet Take 2,000 Units by mouth daily.   Yes [provider]  colchicine-probenecid 0.5-500 MG tablet Take 1 tablet by mouth daily.   Yes [provider]  cyanocobalamin (,VITAMIN B-12,) 1000 MCG/ML injection Inject 1 mL into the muscle every 30 (thirty) days.   Yes [provider]  gabapentin (NEURONTIN) 100 MG capsule Take 100 mg  by mouth 2 (two) times daily. 12/25/17  Yes [provider]  losartan-hydrochlorothiazide (HYZAAR) 100-25 MG tablet Take 1 tablet by mouth daily. 11/07/17  Yes [provider]  torsemide (DEMADEX) 10 MG tablet Take 10 mg by mouth daily.   Yes [provider]  labetalol (NORMODYNE) 100 MG tablet Take 1 tablet (100 mg total) by mouth 2 (two) times daily. Patient not taking: Reported on 12/30/2017 11/18/17   Hillary Bow, MD     VITAL SIGNS:  Blood pressure 124/72, pulse 82, temperature 97.6 F (36.4 C), temperature source Oral, resp. rate 14, SpO2 98 %.  PHYSICAL EXAMINATION:  Physical Exam  GENERAL:  84 y.o.-year-old patient lying in the bed with no acute distress. EYES: Pupils equal, round, reactive to light and accommodation. No scleral icterus. Extraocular muscles intact. HEENT: Head atraumatic, normocephalic. Oropharynx and nasopharynx clear. No oropharyngeal erythema, dry oral mucosa. NECK:  Supple, no jugular venous distention. No thyroid enlargement, no tenderness. LUNGS: Normal breath sounds bilaterally, no wheezing, rales, rhonchi. No use of accessory muscles of respiration. CARDIOVASCULAR: S1, S2 normal. No murmurs, rubs, or gallops. ABDOMEN: Soft, nontender, nondistended. Bowel sounds present. No organomegaly or mass.  EXTREMITIES: No pedal edema, cyanosis, or clubbing. + 2 pedal & radial pulses b/l.   NEUROLOGIC: Cranial nerves II through XII are intact. No focal Motor or sensory deficits appreciated b/l. PSYCHIATRIC: The patient is alert and oriented x2. SKIN: No obvious rash, lesion, or ulcer.  LABORATORY PANEL:   CBC Recent Labs  Lab 12/30/17 1007  WBC 5.9  HGB 12.7  HCT 36.6  PLT 294   ------------------------------------------------------------------------------------------------------------------  Chemistries  Recent Labs  Lab 12/30/17 1007  NA 137  K 3.2*  CL 88*  CO2 35*  GLUCOSE 141*  BUN 128*  CREATININE 2.00*  CALCIUM  11.5*  AST 21  ALT 13  ALKPHOS 44  BILITOT 1.1   ------------------------------------------------------------------------------------------------------------------  Cardiac Enzymes Recent Labs  Lab 12/30/17 1007  TROPONINI 0.13*   ------------------------------------------------------------------------------------------------------------------  RADIOLOGY:  No results found.   IMPRESSION AND PLAN:   * Acute kidney injury due to severe dehydration from poor oral intake.  Will bolus IV fluid now.  Continue maintenance fluids.  Monitor input and output.  Repeat labs in the morning.  We will also check a CK level due to recent rhabdomyolysis.  Stop torsemide  *Hypertension.  Will discontinue hydrochlorothiazide due to dehydration.  Labetalol recently stopped due to bradycardia.  We will continue amlodipine.  *Elevated troponin of 0.13.  No chest pain or shortness of breath.  CK level pending.  Will repeat troponin.  *Dementia with poor oral intake. We will request palliative care consult due to recurrent admissions for the same problem.  *Daily prophylaxis with heparin  All the records are reviewed and case discussed with ED provider. Management plans discussed with the patient, family and they are in agreement.  CODE STATUS: Full code  TOTAL TIME TAKING CARE OF THIS PATIENT: 40 minutes.   Aisea Bouldin  Alfonso Patten Amijah Timothy M.D on 12/30/2017 at 12:33 PM  Between 7am to 6pm - Pager - [phone]  After 6pm go to [email or web address] - password [email or web address] [facility]  Office  [phone]  CC: Primary care physician; Kirk Ruths, MD  Note: This dictation was prepared with Dragon dictation along with smaller phrase technology. Any transcriptional errors that result from this process are unintentional.

## 2017-12-30 NOTE — ED Notes (Signed)
ED Provider at bedside. 

## 2017-12-30 NOTE — ED Notes (Signed)
Admitting MD at bedside.

## 2017-12-30 NOTE — Consult Note (Signed)
[facility] Face-to-Face Psychiatry Consult   Reason for Consult: Consult for this 84 year old woman in the hospital with worsening kidney failure.  Question about capacity. Referring Physician: Sudini Patient Identification: Kimberly Bartlett MRN:  [id number] Principal Diagnosis: Dementia Diagnosis:   Patient Active Problem List   Diagnosis Date Noted  . AKI (acute kidney injury) ([place]) [N17.9] 12/30/2017  . Dementia [F03.90] 12/30/2017  . Acute delirium [R41.0] 12/30/2017  . Acute kidney injury ([place]) [N17.9] 11/15/2017    Total Time spent with patient: 1 hour  Subjective:   Kimberly Bartlett is a 84 y.o. female patient admitted with "I think I lived in the righteousness".  HPI: Patient seen chart reviewed.  This is an 84 year old woman with an established history of some cognitive impairment who is in the hospital now with what seems like rapidly worsening renal failure possibly related to dehydration.  Question was raised about capacity.  Patient was dozing when I came to see her but was easily awakened and stayed awake and participated in the interview.  Patient was very pleasant and cooperative but moderately confused.  When I asked her where we were currently she went off on a gentle religious tangents that had nothing to do with the question.  Same sort of answers to most of the other questions.  She was able to tell me her late husband's name and how many children she had but she could not tell me anything else about where she grew up or where she had ever lived.  Patient answered yes when I asked her if she had high blood pressure and no when I asked her if she had diabetes but was not able to tell me anything about her current medical problems.  She did not remember whether any doctors had been in to see her or whether they had said anything about her medical issues.  She was not able to tell me anything about any plans for the future.  When I asked her about her plans for the future she said it was to  raise her children and teach them to do right.  Medical history: Patient has a history of severe high blood pressure and some renal problems with acute renal injury this time.  Social history: Patient tells me that she had 5 children.  From what I can see in the chart it looks like 1 daughter has mostly been involved in decision making recently.  It looks like the patient probably has been in rehab or assisted living.  Patient has no idea where she has been and is not able to answer any questions about recent social situations.  Substance abuse history: No history or report of any substance abuse  Past Psychiatric History: Nothing in the chart about any past psychiatric history.  It looks like going back over the notes that for at least the past year or so it has been acknowledge that the patient had some cognitive impairment although it looks like that has been more prominent this year than previously.  Risk to Self:   Risk to Others:   Prior Inpatient Therapy:   Prior Outpatient Therapy:    Past Medical History:  Past Medical History:  Diagnosis Date  . Breast cancer ([place])   . Dementia   . Dislocated shoulder, right, sequela   . Glaucoma    both eyes  . Hypertension     Past Surgical History:  Procedure Laterality Date  . ABDOMINAL HYSTERECTOMY    . BREAST SURGERY  bialteral mastectomy  . EYE SURGERY     bilateral cataracts  . hammer toe Bilateral   . heel spur Bilateral   . JOINT REPLACEMENT     bilateral knees  . MASTECTOMY     bilateral   Family History:  Family History  Problem Relation Age of Onset  . Hypertension Mother    Family Psychiatric  History: Nothing known about this.  Not really relevant to the immediate question Social History:  Social History   Substance and Sexual Activity  Alcohol Use Yes   Comment: occassional wine     Social History   Substance and Sexual Activity  Drug Use No    Social History   Socioeconomic History  . Marital  status: Widowed    Spouse name: Not on file  . Number of children: Not on file  . Years of education: Not on file  . Highest education level: Not on file  Occupational History  . Not on file  Social Needs  . Financial resource strain: Not on file  . Food insecurity:    Worry: Never true    Inability: Never true  . Transportation needs:    Medical: No    Non-medical: No  Tobacco Use  . Smoking status: Former Smoker    Types: Cigarettes    Last attempt to quit: 11/05/1948    Years since quitting: 69.1  . Smokeless tobacco: Never Used  Substance and Sexual Activity  . Alcohol use: Yes    Comment: occassional wine  . Drug use: No  . Sexual activity: Not on file  Lifestyle  . Physical activity:    Days per week: 0 days    Minutes per session: Not on file  . Stress: Only a little  Relationships  . Social connections:    Talks on phone: More than three times a week    Gets together: More than three times a week    Attends religious service: Not on file    Active member of club or organization: Not on file    Attends meetings of clubs or organizations: More than 4 times per year    Relationship status: Widowed  Other Topics Concern  . Not on file  Social History Narrative  . Not on file   Additional Social History:    Allergies:  No Known Allergies  Labs:  Results for orders placed or performed during the hospital encounter of 12/30/17 (from the past 48 hour(s))  CBC with Differential     Status: Abnormal   Collection Time: 12/30/17 10:07 AM  Result Value Ref Range   WBC 5.9 3.6 - 11.0 K/uL   RBC 3.76 (L) 3.80 - 5.20 MIL/uL   Hemoglobin 12.7 12.0 - 16.0 g/dL   HCT 36.6 35.0 - 47.0 %   MCV 97.3 80.0 - 100.0 fL   MCH 33.7 26.0 - 34.0 pg   MCHC 34.6 32.0 - 36.0 g/dL   RDW 15.2 (H) 11.5 - 14.5 %   Platelets 294 150 - 440 K/uL   Neutrophils Relative % 84 %   Neutro Abs 4.9 1.4 - 6.5 K/uL   Lymphocytes Relative 12 %   Lymphs Abs 0.7 (L) 1.0 - 3.6 K/uL   Monocytes  Relative 4 %   Monocytes Absolute 0.2 0.2 - 0.9 K/uL   Eosinophils Relative 0 %   Eosinophils Absolute 0.0 0 - 0.7 K/uL   Basophils Relative 0 %   Basophils Absolute 0.0 0 - 0.1 K/uL  Comment: Performed at [facility], [address] [place], [place] [address]  Comprehensive metabolic panel     Status: Abnormal   Collection Time: 12/30/17 10:07 AM  Result Value Ref Range   Sodium 137 135 - 145 mmol/L   Potassium 3.2 (L) 3.5 - 5.1 mmol/L   Chloride 88 (L) 98 - 111 mmol/L   CO2 35 (H) 22 - 32 mmol/L   Glucose, Bld 141 (H) 70 - 99 mg/dL   BUN 128 (H) 8 - 23 mg/dL    Comment: RESULT CONFIRMED BY MANUAL DILUTION. JML   Creatinine, Ser 2.00 (H) 0.44 - 1.00 mg/dL   Calcium 11.5 (H) 8.9 - 10.3 mg/dL   Total Protein 8.6 (H) 6.5 - 8.1 g/dL   Albumin 4.7 3.5 - 5.0 g/dL   AST 21 15 - 41 U/L   ALT 13 0 - 44 U/L   Alkaline Phosphatase 44 38 - 126 U/L   Total Bilirubin 1.1 0.3 - 1.2 mg/dL   GFR calc non Af Amer 21 (L) >60 mL/min   GFR calc Af Amer 25 (L) >60 mL/min    Comment: (NOTE) The eGFR has been calculated using the CKD EPI equation. This calculation has not been validated in all clinical situations. eGFR's persistently <60 mL/min signify possible Chronic Kidney Disease.    Anion gap 14 5 - 15    Comment: Performed at [facility], [address] [place], [place] [address]  Troponin I     Status: Abnormal   Collection Time: 12/30/17 10:07 AM  Result Value Ref Range   Troponin I 0.13 (HH) <0.03 ng/mL    Comment: CRITICAL RESULT CALLED TO, READ BACK BY AND VERIFIED WITH Martinique LOYE, RN 12/30/17 1040 JML/HKP Performed at [facility], [address] [place], [place] [address]   CK     Status: None   Collection Time: 12/30/17 10:07 AM  Result Value Ref Range   Total CK 92 38 - 234 U/L    Comment: Performed at [facility], [address] [place], [place] [address]  TSH     Status: None   Collection Time: 12/30/17 10:07 AM  Result  Value Ref Range   TSH 2.024 0.350 - 4.500 uIU/mL    Comment: Performed by a 3rd Generation assay with a functional sensitivity of <=0.01 uIU/mL. Performed at [facility], [address] [place], [place] [address]   Troponin I     Status: Abnormal   Collection Time: 12/30/17  5:45 PM  Result Value Ref Range   Troponin I 0.13 (HH) <0.03 ng/mL    Comment: CRITICAL VALUE NOTED. VALUE IS CONSISTENT WITH PREVIOUSLY REPORTED/CALLED VALUE KLW Performed at [facility], [address] [place], [place] [address]     Current Facility-Administered Medications  Medication Dose Route Frequency Provider Last Rate Last Dose  . 0.9 % NaCl with KCl 20 mEq/ L  infusion   Intravenous Continuous Hillary Bow, MD 100 mL/hr at 12/30/17 1502    . acetaminophen (TYLENOL) tablet 650 mg  650 mg Oral Q6H PRN Hillary Bow, MD       Or  . acetaminophen (TYLENOL) suppository 650 mg  650 mg Rectal Q6H PRN Sudini, Srikar, MD      . albuterol (PROVENTIL) (2.5 MG/3ML) 0.083% nebulizer solution 2.5 mg  2.5 mg Nebulization Q2H PRN Sudini, Alveta Heimlich, MD      . Derrill Memo ON 12/31/2017] amLODipine (NORVASC) tablet 5 mg  5 mg Oral Daily Sudini, Srikar,  MD      . Derrill Memo ON 12/31/2017] colchicine tablet 0.6 mg  0.6 mg Oral Daily Sudini, Alveta Heimlich, MD       And  . Derrill Memo ON 12/31/2017] probenecid (BENEMID) tablet 500 mg  500 mg Oral Daily Sudini, Srikar, MD      . gabapentin (NEURONTIN) capsule 100 mg  100 mg Oral BID Sudini, Alveta Heimlich, MD      . heparin injection 5,000 Units  5,000 Units Subcutaneous Q8H Hillary Bow, MD   5,000 Units at 12/30/17 1506  . latanoprost (XALATAN) 0.005 % ophthalmic solution 1 drop  1 drop Both Eyes QHS Sudini, Srikar, MD      . ondansetron (ZOFRAN) tablet 4 mg  4 mg Oral Q6H PRN Sudini, Alveta Heimlich, MD       Or  . ondansetron (ZOFRAN) injection 4 mg  4 mg Intravenous Q6H PRN Sudini, Srikar, MD      . polyethylene glycol (MIRALAX / GLYCOLAX) packet 17 g  17 g Oral Daily PRN Sudini, Srikar,  MD      . sodium chloride 0.9 % bolus 1,000 mL  1,000 mL Intravenous Once Hillary Bow, MD        Musculoskeletal: Strength & Muscle Tone: decreased Gait & Station: unable to stand Patient leans: N/A  Psychiatric Specialty Exam: Physical Exam  Nursing note and vitals reviewed. Constitutional: She appears well-developed.  HENT:  Head: Normocephalic and atraumatic.  Eyes: Pupils are equal, round, and reactive to light. Conjunctivae are normal.  Neck: Normal range of motion.  Cardiovascular: Normal heart sounds.  Respiratory: Effort normal.  GI: Soft.  Musculoskeletal: Normal range of motion.  Neurological: She is alert.  Skin: Skin is warm and dry.  Psychiatric: She has a normal mood and affect. Her speech is delayed and tangential. She is slowed. She is not agitated and not aggressive. Thought content is not paranoid. Cognition and memory are impaired. She expresses inappropriate judgment. She expresses no homicidal and no suicidal ideation. She is noncommunicative. She exhibits abnormal recent memory and abnormal remote memory.    Review of Systems  Unable to perform ROS: Mental status change    Blood pressure 102/62, pulse 80, temperature 97.7 F (36.5 C), temperature source Oral, resp. rate 18, SpO2 100 %.There is no height or weight on file to calculate BMI.  General Appearance: Fairly Groomed  Eye Contact:  Minimal  Speech:  Slow  Volume:  Decreased  Mood:  Euthymic  Affect:  Constricted  Thought Process:  Disorganized  Orientation:  Negative  Thought Content:  Tangential  Suicidal Thoughts:  No  Homicidal Thoughts:  No  Memory:  Immediate;   Fair Recent;   Poor Remote;   Poor  Judgement:  Impaired  Insight:  Lacking  Psychomotor Activity:  Decreased  Concentration:  Concentration: Poor  Recall:  Poor  Fund of Knowledge:  Fair  Language:  Good  Akathisia:  No  Handed:  Right  AIMS (if indicated):     Assets:  Social Support  ADL's:  Impaired  Cognition:   Impaired,  Moderate  Sleep:        Treatment Plan Summary: Plan 84 year old woman with some cognitive impairment.  When I saw her this evening she was not even able to tell me where we were, what kind of the building this was, what state she lived in or where she had been recently.  Patient currently lacks any capacity at all to make any sort of significant decisions about her care.  It  is likely that at least some of this is transient and may improve if her dehydration and kidney function and overall medical state and nutrition improve a little bit or may be better in the day light or with family present.  Nevertheless it looks very much like this patient lacks capacity for significant medical decisions.  There is no indication for any medical intervention.  I will try to check by as needed.  Let me know if the situation changes and needs reassessment.  Disposition: No evidence of imminent risk to self or others at present.   Patient does not meet criteria for psychiatric inpatient admission. Supportive therapy provided about ongoing stressors. Discussed crisis plan, support from social network, calling 911, coming to the Emergency Department, and calling Suicide Hotline.  Alethia Berthold, MD 12/30/2017 8:56 PM

## 2017-12-30 NOTE — ED Notes (Signed)
Pt urinated in bed.  Patient cleaned and linens changed by this RN and Threasa Beards, EDT.

## 2017-12-30 NOTE — Progress Notes (Signed)
Patient admitted to room 112 from the ED with AKI. Patient alert oriented time 3, no c/o pain daughter at bedside. Patient in bed at lower position, locked, bedside table, call light and telephone within  Patient reach. Will continue to monitor patient.

## 2017-12-30 NOTE — ED Provider Notes (Signed)
[facility] Emergency Department Provider Note   ____________________________________________    I have reviewed the triage vital signs and the nursing notes.   HISTORY  Chief Complaint Weakness  History limited by possible dementia   HPI Kimberly Bartlett is a 84 y.o. female who presents with weakness and failure to thrive.  Daughter reports the patient recently left skilled nursing 1 week ago and is scheduled to start outpatient rehab but has eaten very little and seems to be weaker and weaker.  No reports of fevers cough, foul-smelling urine.  No diarrhea.  No reports of falls.  Review of medical records illustrates the patient was admitted 1 month ago   Past Medical History:  Diagnosis Date  . Breast cancer ([place])   . Dislocated shoulder, right, sequela   . Glaucoma    both eyes  . Hypertension     Patient Active Problem List   Diagnosis Date Noted  . Acute kidney injury ([place]) 11/15/2017    Past Surgical History:  Procedure Laterality Date  . ABDOMINAL HYSTERECTOMY    . BREAST SURGERY     bialteral mastectomy  . EYE SURGERY     bilateral cataracts  . hammer toe Bilateral   . heel spur Bilateral   . JOINT REPLACEMENT     bilateral knees  . MASTECTOMY     bilateral    Prior to Admission medications   Medication Sig Start Date End Date Taking? Authorizing Provider  amLODipine (NORVASC) 5 MG tablet Take 5 mg by mouth daily.    Yes [provider]  bimatoprost (LUMIGAN) 0.01 % SOLN Place 1 drop into both eyes at bedtime.   Yes [provider]  Cholecalciferol (VITAMIN D) 2000 units tablet Take 2,000 Units by mouth daily.   Yes [provider]  colchicine-probenecid 0.5-500 MG tablet Take 1 tablet by mouth daily.   Yes [provider]  cyanocobalamin (,VITAMIN B-12,) 1000 MCG/ML injection Inject 1 mL into the muscle every 30 (thirty) days.   Yes [provider]  gabapentin (NEURONTIN) 100 MG  capsule Take 100 mg by mouth 2 (two) times daily. 12/25/17  Yes [provider]  losartan-hydrochlorothiazide (HYZAAR) 100-25 MG tablet Take 1 tablet by mouth daily. 11/07/17  Yes [provider]  torsemide (DEMADEX) 10 MG tablet Take 10 mg by mouth daily.   Yes [provider]  labetalol (NORMODYNE) 100 MG tablet Take 1 tablet (100 mg total) by mouth 2 (two) times daily. Patient not taking: Reported on 12/30/2017 11/18/17   Hillary Bow, MD     Allergies Patient has no known allergies.  Family History  Problem Relation Age of Onset  . Hypertension Mother     Social History Social History   Tobacco Use  . Smoking status: Former Smoker    Types: Cigarettes    Last attempt to quit: 11/05/1948    Years since quitting: 69.1  . Smokeless tobacco: Never Used  Substance Use Topics  . Alcohol use: Yes    Comment: occassional wine  . Drug use: No    Review of Systems  Constitutional: No fever/chills Eyes: No visual changes.  ENT: No sore throat. Cardiovascular: Denies chest pain. Respiratory: Denies shortness of breath. Gastrointestinal: No abdominal pain.  No nausea, no vomiting.   Genitourinary: Negative for dysuria. Musculoskeletal: Negative for back pain. Skin: Negative for rash. Neurological: Negative for headaches or weakness   ____________________________________________   PHYSICAL EXAM:  VITAL SIGNS: ED Triage Vitals  Enc Vitals Group     BP 12/30/17 0952 123/81     Pulse Rate 12/30/17 0952 82     Resp 12/30/17 0952 14     Temp 12/30/17 0952 97.6 F (36.4 C)     Temp Source 12/30/17 0952 Oral     SpO2 12/30/17 0952 98 %     Weight --      Height --      Head Circumference --      Peak Flow --      Pain Score 12/30/17 0951 3     Pain Loc --      Pain Edu? --      Excl. in [place]? --     Constitutional: Alert   Eyes: Conjunctivae are normal.   Nose: No congestion/rhinnorhea. Mouth/Throat: Mucous membranes are dry    Cardiovascular: Normal rate, regular rhythm. Grossly normal heart sounds.  Good peripheral circulation. Respiratory: Normal respiratory effort.  No retractions. Lungs CTAB. Gastrointestinal: Soft and nontender. No distention.    Musculoskeletal:   Warm and well perfused Neurologic:  Normal speech and language. No gross focal neurologic deficits are appreciated.  Skin:  Skin is warm, dry and intact. No rash noted.   ____________________________________________   LABS (all labs ordered are listed, but only abnormal results are displayed)  Labs Reviewed  CBC WITH DIFFERENTIAL/PLATELET - Abnormal; Notable for the following components:      Result Value   RBC 3.76 (*)    RDW 15.2 (*)    Lymphs Abs 0.7 (*)    All other components within normal limits  COMPREHENSIVE METABOLIC PANEL - Abnormal; Notable for the following components:   Potassium 3.2 (*)    Chloride 88 (*)    CO2 35 (*)    Glucose, Bld 141 (*)    BUN 128 (*)    Creatinine, Ser 2.00 (*)    Calcium 11.5 (*)    Total Protein 8.6 (*)    GFR calc non Af Amer 21 (*)    GFR calc Af Amer 25 (*)    All other components within normal limits  TROPONIN I - Abnormal; Notable for the following components:   Troponin I 0.13 (*)    All other components within normal limits  URINALYSIS, COMPLETE (UACMP) WITH MICROSCOPIC   ____________________________________________  EKG  ED ECG REPORT I, Lavonia Drafts, the attending physician, personally viewed and interpreted this ECG.  Date: 12/30/2017  Rhythm: normal sinus rhythm QRS Axis: normal Intervals: Abnormal ST/T Wave abnormalities: Nonspecific changes   ____________________________________________  RADIOLOGY  None ____________________________________________   PROCEDURES  Procedure(s) performed: No  Procedures   Critical Care performed: no    ____________________________________________   INITIAL IMPRESSION / ASSESSMENT AND PLAN / ED COURSE  Pertinent  labs & imaging results that were available during my care of the patient were reviewed by me and considered in my medical decision making (see chart for details).  Patient presents with essentially failure to thrive.  We will check lab work, urinalysis to evaluate for possible infection.  Lab work is significant for acute kidney injury with a BUN of 128 consistent with severe dehydration, IV fluids infusing.  Also notable is the patient's troponin is elevated at 0.13, EKG demonstrates nonspecific ST changes but this will need to be trended    ____________________________________________   FINAL CLINICAL IMPRESSION(S) / ED DIAGNOSES  Final diagnoses:  Dehydration  AKI (acute kidney injury) ([place])        Note:  This document was  prepared using Systems analyst and may include unintentional dictation errors.    Lavonia Drafts, MD 12/30/17 1157

## 2017-12-30 NOTE — ED Notes (Signed)
Date and time results received: 12/30/17 1040  (use smartphrase ".now" to insert current time)  Test: troponin Critical Value: 0.13  Name of Provider Notified: MD Corky Downs

## 2017-12-30 NOTE — Progress Notes (Signed)
Advance care planning  Purpose of Encounter Acute hospitalization due to acute kidney injury, dehydration, failure to thrive and CODE STATUS discussion  Parties in Attendance Patient's daughter Kimberly Bartlett  Patients Decisional capacity Patient does have early dementia and is not oriented to time.  Discussed with daughter regarding patient's repeat hospitalization with similar problems due to decreased oral intake contributed by dementia.  Discussed prognosis. Discussed CODE STATUS and patient wanted to be a full code in the past.  There is no documentation.  No designated healthcare power of attorney.  Patient has 2 daughters of the second daughter in [place].  We discussed about consulting psychiatry for competency test for patient with medical decision making.  We will also consult palliative care.  This could be a recurrent problem with decreased oral intake and dementia. For now patient is full code.  Time spent - 17 minutes

## 2017-12-30 NOTE — Plan of Care (Signed)
  Problem: Health Behavior/Discharge Planning: Goal: Ability to manage health-related needs will improve Outcome: Progressing   Problem: Clinical Measurements: Goal: Ability to maintain clinical measurements within normal limits will improve Outcome: Progressing Goal: Will remain free from infection Outcome: Progressing Goal: Diagnostic test results will improve Outcome: Progressing Goal: Respiratory complications will improve Outcome: Progressing Goal: Cardiovascular complication will be avoided Outcome: Progressing   Problem: Activity: Goal: Risk for activity intolerance will decrease Outcome: Progressing   Problem: Coping: Goal: Level of anxiety will decrease Outcome: Progressing   Problem: Elimination: Goal: Will not experience complications related to urinary retention Outcome: Progressing   Problem: Pain Managment: Goal: General experience of comfort will improve Outcome: Progressing   Problem: Safety: Goal: Ability to remain free from injury will improve Outcome: Progressing

## 2017-12-31 ENCOUNTER — Other Ambulatory Visit: Payer: Self-pay

## 2017-12-31 DIAGNOSIS — L8990 Pressure ulcer of unspecified site, unspecified stage: Secondary | ICD-10-CM

## 2017-12-31 DIAGNOSIS — E43 Unspecified severe protein-calorie malnutrition: Secondary | ICD-10-CM

## 2017-12-31 LAB — COMPREHENSIVE METABOLIC PANEL
ALBUMIN: 3.7 g/dL (ref 3.5–5.0)
ALT: 11 U/L (ref 0–44)
AST: 22 U/L (ref 15–41)
Alkaline Phosphatase: 35 U/L — ABNORMAL LOW (ref 38–126)
Anion gap: 9 (ref 5–15)
BILIRUBIN TOTAL: 1 mg/dL (ref 0.3–1.2)
BUN: 96 mg/dL — AB (ref 8–23)
CHLORIDE: 100 mmol/L (ref 98–111)
CO2: 32 mmol/L (ref 22–32)
Calcium: 10.2 mg/dL (ref 8.9–10.3)
Creatinine, Ser: 1.22 mg/dL — ABNORMAL HIGH (ref 0.44–1.00)
GFR calc Af Amer: 45 mL/min — ABNORMAL LOW (ref >60)
GFR, EST NON AFRICAN AMERICAN: 39 mL/min — AB (ref >60)
Glucose, Bld: 109 mg/dL — ABNORMAL HIGH (ref 70–99)
POTASSIUM: 2.7 mmol/L — AB (ref 3.5–5.1)
SODIUM: 141 mmol/L (ref 135–145)
TOTAL PROTEIN: 6.8 g/dL (ref 6.5–8.1)

## 2017-12-31 LAB — CBC
HEMATOCRIT: 32.6 % — AB (ref 35.0–47.0)
Hemoglobin: 11.2 g/dL — ABNORMAL LOW (ref 12.0–16.0)
MCH: 33.8 pg (ref 26.0–34.0)
MCHC: 34.5 g/dL (ref 32.0–36.0)
MCV: 98.1 fL (ref 80.0–100.0)
Platelets: 276 10*3/uL (ref 150–440)
RBC: 3.32 MIL/uL — AB (ref 3.80–5.20)
RDW: 14.8 % — AB (ref 11.5–14.5)
WBC: 5.3 10*3/uL (ref 3.6–11.0)

## 2017-12-31 LAB — MAGNESIUM: MAGNESIUM: 2.5 mg/dL — AB (ref 1.7–2.4)

## 2017-12-31 LAB — TROPONIN I: Troponin I: 0.16 ng/mL (ref <0.03)

## 2017-12-31 MED ORDER — POTASSIUM CHLORIDE CRYS ER 20 MEQ PO TBCR
20.0000 meq | EXTENDED_RELEASE_TABLET | Freq: Two times a day (BID) | ORAL | Status: DC
  Administered 2017-12-31: 20 meq via ORAL
  Filled 2017-12-31 (×3): qty 1

## 2017-12-31 MED ORDER — ENSURE ENLIVE PO LIQD
237.0000 mL | Freq: Two times a day (BID) | ORAL | Status: DC
Start: 2017-12-31 — End: 2018-01-03
  Administered 2017-12-31 – 2018-01-03 (×5): 237 mL via ORAL

## 2017-12-31 MED ORDER — OCUVITE-LUTEIN PO CAPS
1.0000 | ORAL_CAPSULE | Freq: Every day | ORAL | Status: DC
  Administered 2017-12-31 – 2018-01-03 (×4): 1 via ORAL
  Filled 2017-12-31 (×4): qty 1

## 2017-12-31 MED ORDER — POTASSIUM CHLORIDE CRYS ER 20 MEQ PO TBCR
40.0000 meq | EXTENDED_RELEASE_TABLET | Freq: Two times a day (BID) | ORAL | Status: DC
  Administered 2017-12-31 – 2018-01-01 (×3): 40 meq via ORAL
  Filled 2017-12-31 (×3): qty 2

## 2017-12-31 NOTE — Progress Notes (Signed)
Notified hospitalist via text page of troponin value of 0.16.

## 2017-12-31 NOTE — Progress Notes (Signed)
Initial Nutrition Assessment  DOCUMENTATION CODES:   Severe malnutrition in context of social or environmental circumstances  INTERVENTION:   - Ensure Enlive po BID, each supplement provides 350 kcal and 20 grams of protein  - Ocuvite daily  NUTRITION DIAGNOSIS:   Severe Malnutrition related to chronic illness(dementia) as evidenced by moderate fat depletion, severe fat depletion, mild muscle depletion, moderate muscle depletion, severe muscle depletion.  GOAL:   Patient will meet greater than or equal to 90% of their needs  MONITOR:   PO intake, Supplement acceptance, Weight trends, Skin, I & O's, Labs  REASON FOR ASSESSMENT:   Malnutrition Screening Tool    ASSESSMENT:   84 year old female who presented to the ED with weakness. PMH significant for hypertension, dementia, breast cancer, and AKI. Pt was recently admitted to the hospital for rhabdomyolysis.  Noted pt with colostomy.  Pt denies weight loss and reports her UBW as 160 lbs. RD obtained bed weight at time of visit: 162.9 lbs. However, weight taken out of position due to pt taking medications. RN in room providing nursing care at time of visit.  Pt is poor historian. No family present at time of visit. Pt states she "eats what I can hold" and typically eats 3 meals daily. Per pt, a meal includes a "vegetable, protein, bread, and coffee." Pt states that her daughter prepares her food "and lives with me."  Pt agreeable to trying Ensure Enlive oral nutrition supplement during admission. RD explained purpose of oral nutrition supplements and importance of adequate kcal and protein intake in maintaining lean muscle mass and aiding in wound healing.  Medications reviewed and include: 40 mEq K-dur, IV KCl  Labs reviewed: potassium 2.7 (L), BUN 96 (H), creatinine 1.22 (H)  NUTRITION - FOCUSED PHYSICAL EXAM:    Most Recent Value  Orbital Region  Moderate depletion  Upper Arm Region  Severe depletion  Thoracic and  Lumbar Region  Severe depletion  Buccal Region  Moderate depletion  Temple Region  Mild depletion  Clavicle Bone Region  Severe depletion  Clavicle and Acromion Bone Region  Severe depletion  Scapular Bone Region  Unable to assess  Dorsal Hand  Mild depletion  Patellar Region  Moderate depletion  Anterior Thigh Region  Severe depletion  Posterior Calf Region  Moderate depletion  Edema (RD Assessment)  Mild [BLE]  Hair  Reviewed  Eyes  Reviewed  Mouth  Reviewed  Skin  Reviewed  Nails  Reviewed       Diet Order:   Diet Order           Diet regular Room service appropriate? Yes; Fluid consistency: Thin  Diet effective now          EDUCATION NEEDS:   Education needs have been addressed  Skin:  Skin Assessment: Skin Integrity Issues: Unstageable to L foot, R foot  Last BM:  12/29/17  Height:   Ht Readings from Last 1 Encounters:  11/15/17 5\' 3"  (1.6 m)    Weight:   Wt Readings from Last 1 Encounters:  11/15/17 155 lb 10.3 oz (70.6 kg)    Ideal Body Weight:  52.27 kg  BMI:  There is no height or weight on file to calculate BMI.  Estimated Nutritional Needs:   Kcal:  1500-1700 kcal/day  Protein:  80-95 grams/day  Fluid:  >/= 1.6 L/day    Gaynell Face, MS, RD, LDN Pager: [phone] Weekend/After Hours: [phone]

## 2017-12-31 NOTE — Consult Note (Signed)
[facility] Nurse wound consult note Reason for Consult: Unstageable full thickness ulcer (pressure injury vs other ischemic injury). Patient is not able to give me any details about this tissue injury. Wound type: See above Pressure Injury POA: Yes Measurement: 9cm x 4.4cm area of hard, black, firmly attached, stable eschar.  Wound bed:As described above Drainage (amount, consistency, odor) None Periwound:intact, dry, some bilateral LE edema Dressing procedure/placement/frequency: I have implemented a conservative POC for this large necrotic area that is stable, with no periwound erythema, induration, warmth or pain.  I willprovide Nursing with Guidance to paint the area with a betadine swabstick twice daily and allow it to air dry, then dress with dry gauze and secure in an atraumatic manner. Pressure redistribution heel boots are provided bilaterally. A sacral prophylactic foam dressing is requested and will be placed after patient gets off of the bedpan.  If a more aggressive POC is desired, please contact podiatry or surgery for their input.  [facility] nursing team will not follow, but will remain available to this patient, the nursing and medical teams.  Please re-consult if needed. Thanks, Maudie Flakes, MSN, RN, [place], Arther Abbott  Pager# [phone]

## 2017-12-31 NOTE — Progress Notes (Signed)
[facility] NAME: Kimberly Bartlett    MR#:  [id number]  DATE OF BIRTH:  12/11/1927  SUBJECTIVE:  CHIEF COMPLAINT:   Chief Complaint  Patient presents with  . Weakness   Brought with generalized weakness and dehydration. Also has some dementia. Appears comfortable today renal function slightly better, have hypokalemia.  REVIEW OF SYSTEMS:  CONSTITUTIONAL: No fever, have fatigue or weakness.  EYES: No blurred or double vision.  EARS, NOSE, AND THROAT: No tinnitus or ear pain.  RESPIRATORY: No cough, shortness of breath, wheezing or hemoptysis.  CARDIOVASCULAR: No chest pain, orthopnea, edema.  GASTROINTESTINAL: No nausea, vomiting, diarrhea or abdominal pain.  GENITOURINARY: No dysuria, hematuria.  ENDOCRINE: No polyuria, nocturia,  HEMATOLOGY: No anemia, easy bruising or bleeding SKIN: No rash or lesion. MUSCULOSKELETAL: No joint pain or arthritis.   NEUROLOGIC: No tingling, numbness, weakness.  PSYCHIATRY: No anxiety or depression.   ROS  DRUG ALLERGIES:  No Known Allergies  VITALS:  Blood pressure 121/71, pulse (!) 57, temperature 98.3 F (36.8 C), temperature source Oral, resp. rate 16, SpO2 100 %.  PHYSICAL EXAMINATION:  GENERAL:  84 y.o.-year-old patient lying in the bed with no acute distress.  EYES: Pupils equal, round, reactive to light and accommodation. No scleral icterus. Extraocular muscles intact.  HEENT: Head atraumatic, normocephalic. Oropharynx and nasopharynx clear.  NECK:  Supple, no jugular venous distention. No thyroid enlargement, no tenderness.  LUNGS: Normal breath sounds bilaterally, no wheezing, rales,rhonchi or crepitation. No use of accessory muscles of respiration.  CARDIOVASCULAR: S1, S2 normal. No murmurs, rubs, or gallops.  ABDOMEN: Soft, nontender, nondistended. Bowel sounds present. No organomegaly or mass.  EXTREMITIES: No pedal edema, cyanosis, or clubbing.  NEUROLOGIC: Cranial nerves II  through XII are intact. Muscle strength 4/5 in all extremities. Sensation intact. Gait not checked.  PSYCHIATRIC: The patient is alert and oriented x 2.  SKIN: No obvious rash, lesion, or ulcer.   Physical Exam LABORATORY PANEL:   CBC Recent Labs  Lab 12/31/17 0411  WBC 5.3  HGB 11.2*  HCT 32.6*  PLT 276   ------------------------------------------------------------------------------------------------------------------  Chemistries  Recent Labs  Lab 12/31/17 0411  NA 141  K 2.7*  CL 100  CO2 32  GLUCOSE 109*  BUN 96*  CREATININE 1.22*  CALCIUM 10.2  MG 2.5*  AST 22  ALT 11  ALKPHOS 35*  BILITOT 1.0   ------------------------------------------------------------------------------------------------------------------  Cardiac Enzymes Recent Labs  Lab 12/30/17 2245 12/31/17 0411  TROPONINI 0.16* 0.16*   ------------------------------------------------------------------------------------------------------------------  RADIOLOGY:  No results found.  ASSESSMENT AND PLAN:   Principal Problem:   Dementia Active Problems:   AKI (acute kidney injury) ([place])   Acute delirium   Pressure injury of skin   Protein-calorie malnutrition, severe  * Acute kidney injury due to severe dehydration from poor oral intake.     Continue maintenance fluids.  Monitor input and output.  Repeat labs in the morning.   CK not high. Stop torsemide  *Hypertension.  Will discontinue hydrochlorothiazide due to dehydration.  Labetalol recently stopped due to bradycardia.  We will continue amlodipine.  * hypokalemia   Replace oral and IV   MG normal.  *Elevated troponin of 0.13.  No chest pain or shortness of breath.  stable repeat troponin.  *Dementia with poor oral intake. We will request palliative care consult due to recurrent admissions for the same problem.  *DVT prophylaxis with heparin  All the records are reviewed and case discussed with Care  Management/Social  Workerr. Management plans discussed with the patient, family and they are in agreement.  CODE STATUS: full.  TOTAL TIME TAKING CARE OF THIS PATIENT: 35 minutes.    POSSIBLE D/C IN 1-2 DAYS, DEPENDING ON CLINICAL CONDITION.   Vaughan Basta M.D on 12/31/2017   Between 7am to 6pm - Pager - [phone]  After 6pm go to [email or web address] - password [email or web address] [facility]  Office  [phone]  CC: Primary care physician; Kirk Ruths, MD  Note: This dictation was prepared with Dragon dictation along with smaller phrase technology. Any transcriptional errors that result from this process are unintentional.

## 2017-12-31 NOTE — Plan of Care (Signed)
  Problem: Clinical Measurements: Goal: Ability to maintain clinical measurements within normal limits will improve Outcome: Progressing Goal: Will remain free from infection Outcome: Progressing Goal: Diagnostic test results will improve Outcome: Progressing Goal: Respiratory complications will improve Outcome: Progressing Goal: Cardiovascular complication will be avoided Outcome: Progressing   Problem: Coping: Goal: Level of anxiety will decrease Outcome: Progressing   Problem: Pain Managment: Goal: General experience of comfort will improve Outcome: Progressing   Problem: Safety: Goal: Ability to remain free from injury will improve Outcome: Progressing   Problem: Skin Integrity: Goal: Risk for impaired skin integrity will decrease Outcome: Progressing   

## 2017-12-31 NOTE — Progress Notes (Signed)
PT Cancellation Note  Patient Details Name: Kimberly Bartlett MRN: [id number] DOB: 07/31/1927   Cancelled Treatment:    Reason Eval/Treat Not Completed: Medical issues which prohibited therapy; Pt's Ka 2.7 trending down falling outside guidelines for participation with PT services.  Will attempt to see pt at a future date/time as medically appropriate.     Linus Salmons PT, DPT 12/31/17, 8:26 AM

## 2017-12-31 NOTE — Progress Notes (Signed)
Text paged hospitalist about critical lab value of 2.7 potassium; and 0.16 troponin.

## 2017-12-31 NOTE — Plan of Care (Signed)
Family meeting at 11:30 tomorrow morning.

## 2017-12-31 NOTE — Plan of Care (Signed)
  Problem: Education: Goal: Knowledge of General Education information will improve Description: Including pain rating scale, medication(s)/side effects and non-pharmacologic comfort measures Outcome: Progressing   Problem: Elimination: Goal: Will not experience complications related to urinary retention Outcome: Progressing   Problem: Safety: Goal: Ability to remain free from injury will improve Outcome: Progressing   Problem: Skin Integrity: Goal: Risk for impaired skin integrity will decrease Outcome: Progressing   

## 2018-01-01 DIAGNOSIS — E43 Unspecified severe protein-calorie malnutrition: Secondary | ICD-10-CM

## 2018-01-01 DIAGNOSIS — Z515 Encounter for palliative care: Secondary | ICD-10-CM

## 2018-01-01 DIAGNOSIS — E860 Dehydration: Secondary | ICD-10-CM

## 2018-01-01 DIAGNOSIS — Z7189 Other specified counseling: Secondary | ICD-10-CM

## 2018-01-01 LAB — BASIC METABOLIC PANEL
Anion gap: 5 (ref 5–15)
BUN: 44 mg/dL — ABNORMAL HIGH (ref 8–23)
CALCIUM: 10.5 mg/dL — AB (ref 8.9–10.3)
CO2: 28 mmol/L (ref 22–32)
Chloride: 113 mmol/L — ABNORMAL HIGH (ref 98–111)
Creatinine, Ser: 1 mg/dL (ref 0.44–1.00)
GFR calc Af Amer: 57 mL/min — ABNORMAL LOW (ref >60)
GFR, EST NON AFRICAN AMERICAN: 49 mL/min — AB (ref >60)
GLUCOSE: 104 mg/dL — AB (ref 70–99)
Potassium: 5.3 mmol/L — ABNORMAL HIGH (ref 3.5–5.1)
Sodium: 146 mmol/L — ABNORMAL HIGH (ref 135–145)

## 2018-01-01 LAB — CALCIUM, IONIZED: CALCIUM, IONIZED, SERUM: 5.3 mg/dL (ref 4.5–5.6)

## 2018-01-01 MED ORDER — SODIUM CHLORIDE 0.45 % IV SOLN
INTRAVENOUS | Status: DC
  Administered 2018-01-01 – 2018-01-02 (×3): via INTRAVENOUS

## 2018-01-01 NOTE — Consult Note (Addendum)
Consultation Note Date: 01/01/2018   Patient Name: Kimberly Bartlett  DOB: 1927/12/10  MRN: [id number]  Age / Sex: 84 y.o., female  PCP: Kirk Ruths, MD Referring Physician: Vaughan Basta, *  Reason for Consultation: Establishing goals of care  HPI/Patient Profile: Kimberly Bartlett  is a 84 y.o. female with a known history of hypertension, glaucoma, dementia, breast cancer who was recently in the hospital for rhabdomyolysis, acute kidney injury returns from home with weakness, not eating and drinking well.   Clinical Assessment and Goals of Care: Patient is resting in bed. She knows name, age, what city she lives in, what city she is currently in and that we are in the hospital.  Her daughter [name] is at bedside. Patient needing to go to the bathroom. Stepped out with daughter for privacy. 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 She has 2 living children.  [name] states her sister lives up [place] and has mental health issues and is not able to help with her mother.   She states her mother hates having her there as her mother has to be in control. She states her mother needs assistance but won't admit it. Her mother had a hospital admission around a month ago and spent time in rehab. She was discharged from rehab a week ago. Since the discharge she has been resting in bed. She has home health that comes into the home to help with bathing. Carol brushes her dentures.   We discussed life prior to her 1 month ago. [name] states she has lived with her mother for 3 years. 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 She states one of her doors are padlocked with no key, and the other had so much stuff around it, items had to be moved out to get her out. She states her mother padlocks the refrigerator, cabinets, and closet doors as she is afraid people steal from her. [name] states items around the yard have disappeared.      She stores steak and other food all over the house such as in her sewing box and under a cakeplate. [name] states her mother sleeps until around 11:00am, goes shopping during the day, and comes back and stays up all night hiding the items she bought so nobody will steal them. She states prior to this admission, her mother shopped for 10 days straight, and was not sleeping or eating. Her mother only eats spoon fulls of food.   Her mother has an adjustable bed which she will not adjust and uses a stool to get in bed. She has incontinence issues where she urinates in the bed and as she walks to the bathroom. She "wall walks" using the items in the house.    [name] states her mother does not pay bills, spending her money shopping while she pays the bills for her mother.   [name] states her mother did not want to go to the hospital last admission, but she took her because she collapsed following 10 days of  shopping.   [name] worries that her mother needs help with these issues, but states because of her profession, she knows how to say the correct things.      [name] states her mother has not been eating or drinking well since D/C from rehab 1 week ago. Kimberly Bartlett has been angry with her daughter. She has been up and about the home at night. She states she did not like rehab.   [name] has been updated by primary team on her mother's status, and explains concerns well. Attempted to discuss goals of care with patient, however she changed subject. She did state she would not allow a feeding tube. Her daughter is leaning toward home with hospice.      SUMMARY OF RECOMMENDATIONS    Request psych reassessment as patient is answering questions more appropriately at this time and may be better able to participate. Eval to determine if medications are needed for any psych diagnoses related to the above vs dementia. Request psych to speak with daughter [name]. ?Change in capacity   Code Status/Advance Care  Planning:  Full code    Symptom Management:   No complaints  Palliative Prophylaxis:   Eye Care and Oral Care  Prognosis:   < 6 months on current trajectory. Electrolyte imbalance. Dehydration. Poor PO intake.   Discharge Planning: To Be Determined      Primary Diagnoses: Present on Admission: . AKI (acute kidney injury) ([place])   I have reviewed the medical record, interviewed the patient and family, and examined the patient. The following aspects are pertinent.  Past Medical History:  Diagnosis Date  . Breast cancer ([place])   . Dementia   . Dislocated shoulder, right, sequela   . Glaucoma    both eyes  . Hypertension    Social History   Socioeconomic History  . Marital status: Widowed    Spouse name: Not on file  . Number of children: Not on file  . Years of education: Not on file  . Highest education level: Not on file  Occupational History  . Not on file  Social Needs  . Financial resource strain: Not on file  . Food insecurity:    Worry: Never true    Inability: Never true  . Transportation needs:    Medical: No    Non-medical: No  Tobacco Use  . Smoking status: Former Smoker    Types: Cigarettes    Last attempt to quit: 11/05/1948    Years since quitting: 69.2  . Smokeless tobacco: Never Used  Substance and Sexual Activity  . Alcohol use: Yes    Comment: occassional wine  . Drug use: No  . Sexual activity: Not on file  Lifestyle  . Physical activity:    Days per week: 0 days    Minutes per session: Not on file  . Stress: Only a little  Relationships  . Social connections:    Talks on phone: More than three times a week    Gets together: More than three times a week    Attends religious service: Not on file    Active member of club or organization: Not on file    Attends meetings of clubs or organizations: More than 4 times per year    Relationship status: Widowed  Other Topics Concern  . Not on file  Social History Narrative  . Not  on file   Family History  Problem Relation Age of Onset  . Hypertension Mother    Scheduled  Meds: . amLODipine  5 mg Oral Daily  . colchicine  0.6 mg Oral Daily   And  . probenecid  500 mg Oral Daily  . feeding supplement (ENSURE ENLIVE)  237 mL Oral BID BM  . gabapentin  100 mg Oral BID  . heparin  5,000 Units Subcutaneous Q8H  . latanoprost  1 drop Both Eyes QHS  . multivitamin-lutein  1 capsule Oral Daily   Continuous Infusions: . sodium chloride 75 mL/hr at 01/01/18 1051  . sodium chloride     PRN Meds:.acetaminophen **OR** acetaminophen, albuterol, ondansetron **OR** ondansetron (ZOFRAN) IV, polyethylene glycol Medications Prior to Admission:  Prior to Admission medications   Medication Sig Start Date End Date Taking? Authorizing Provider  amLODipine (NORVASC) 5 MG tablet Take 5 mg by mouth daily.    Yes [provider]  bimatoprost (LUMIGAN) 0.01 % SOLN Place 1 drop into both eyes at bedtime.   Yes [provider]  Cholecalciferol (VITAMIN D) 2000 units tablet Take 2,000 Units by mouth daily.   Yes [provider]  colchicine-probenecid 0.5-500 MG tablet Take 1 tablet by mouth daily.   Yes [provider]  cyanocobalamin (,VITAMIN B-12,) 1000 MCG/ML injection Inject 1 mL into the muscle every 30 (thirty) days.   Yes [provider]  gabapentin (NEURONTIN) 100 MG capsule Take 100 mg by mouth 2 (two) times daily. 12/25/17  Yes [provider]  losartan-hydrochlorothiazide (HYZAAR) 100-25 MG tablet Take 1 tablet by mouth daily. 11/07/17  Yes [provider]  torsemide (DEMADEX) 10 MG tablet Take 10 mg by mouth daily.   Yes [provider]  labetalol (NORMODYNE) 100 MG tablet Take 1 tablet (100 mg total) by mouth 2 (two) times daily. Patient not taking: Reported on 12/30/2017 11/18/17   Hillary Bow, MD   No Known Allergies Review of Systems  All other systems reviewed and are negative.   Physical Exam    Constitutional: No distress.  Pulmonary/Chest: Effort normal.  Neurological: She is alert.    Vital Signs: BP (!) 155/68 (BP Location: Left Arm)   Pulse (!) 57   Temp (!) 97.5 F (36.4 C) (Oral)   Resp 18   Wt 73.9 kg (162 lb 14.7 oz)   SpO2 98%   BMI 28.86 kg/m  Pain Scale: 0-10 POSS *See Group Information*: 1-Acceptable,Awake and alert Pain Score: 0-No pain   SpO2: SpO2: 98 % O2 Device:SpO2: 98 % O2 Flow Rate: .   IO: Intake/output summary:   Intake/Output Summary (Last 24 hours) at 01/01/2018 1232 Last data filed at 01/01/2018 1053 Gross per 24 hour  Intake 837 ml  Output -  Net 837 ml    LBM: Last BM Date: 12/31/17 Baseline Weight: Weight: 73.9 kg (162 lb 14.7 oz) Most recent weight: Weight: 73.9 kg (162 lb 14.7 oz)     Palliative Assessment/Data: 60%     Time In: 11:30 Time Out: 1:00 Time Total: 90 min Greater than 50%  of this time was spent counseling and coordinating care related to the above assessment and plan.  Signed by: Asencion Gowda, NP   Please contact Palliative Medicine Team phone at [phone] for questions and concerns.  For individual provider: See Shea Evans

## 2018-01-01 NOTE — Progress Notes (Signed)
[facility] [facility] NAME: Kimberly Bartlett    MR#:  [id number]  DATE OF BIRTH:  [birth year]  SUBJECTIVE:  CHIEF COMPLAINT:   Chief Complaint  Patient presents with  . Weakness   Brought with generalized weakness and dehydration. Also has some dementia. Appears comfortable today renal function better, have hyperkalemia now.  REVIEW OF SYSTEMS:  CONSTITUTIONAL: No fever, have fatigue or weakness.  EYES: No blurred or double vision.  EARS, NOSE, AND THROAT: No tinnitus or ear pain.  RESPIRATORY: No cough, shortness of breath, wheezing or hemoptysis.  CARDIOVASCULAR: No chest pain, orthopnea, edema.  GASTROINTESTINAL: No nausea, vomiting, diarrhea or abdominal pain.  GENITOURINARY: No dysuria, hematuria.  ENDOCRINE: No polyuria, nocturia,  HEMATOLOGY: No anemia, easy bruising or bleeding SKIN: No rash or lesion. MUSCULOSKELETAL: No joint pain or arthritis.   NEUROLOGIC: No tingling, numbness, weakness.  PSYCHIATRY: No anxiety or depression.   ROS  DRUG ALLERGIES:  No Known Allergies  VITALS:  Blood pressure (!) 155/68, pulse (!) 57, temperature (!) 97.5 F (36.4 C), temperature source Oral, resp. rate 18, weight 73.9 kg (162 lb 14.7 oz), SpO2 98 %.  PHYSICAL EXAMINATION:  GENERAL:  84 y.o.-year-old patient lying in the bed with no acute distress.  EYES: Pupils equal, round, reactive to light and accommodation. No scleral icterus. Extraocular muscles intact.  HEENT: Head atraumatic, normocephalic. Oropharynx and nasopharynx clear.  NECK:  Supple, no jugular venous distention. No thyroid enlargement, no tenderness.  LUNGS: Normal breath sounds bilaterally, no wheezing, rales,rhonchi or crepitation. No use of accessory muscles of respiration.  CARDIOVASCULAR: S1, S2 normal. No murmurs, rubs, or gallops.  ABDOMEN: Soft, nontender, nondistended. Bowel sounds present. No organomegaly or mass.  EXTREMITIES: No pedal edema, cyanosis, or  clubbing.  NEUROLOGIC: Cranial nerves II through XII are intact. Muscle strength 4/5 in all extremities. Sensation intact. Gait not checked.  PSYCHIATRIC: The patient is alert and oriented x 2.  SKIN: No obvious rash, lesion, or ulcer.   Physical Exam LABORATORY PANEL:   CBC Recent Labs  Lab 12/31/17 0411  WBC 5.3  HGB 11.2*  HCT 32.6*  PLT 276   ------------------------------------------------------------------------------------------------------------------  Chemistries  Recent Labs  Lab 12/31/17 0411 01/01/18 0933  NA 141 146*  K 2.7* 5.3*  CL 100 113*  CO2 32 28  GLUCOSE 109* 104*  BUN 96* 44*  CREATININE 1.22* 1.00  CALCIUM 10.2 10.5*  MG 2.5*  --   AST 22  --   ALT 11  --   ALKPHOS 35*  --   BILITOT 1.0  --    ------------------------------------------------------------------------------------------------------------------  Cardiac Enzymes Recent Labs  Lab 12/30/17 2245 12/31/17 0411  TROPONINI 0.16* 0.16*   ------------------------------------------------------------------------------------------------------------------  RADIOLOGY:  No results found.  ASSESSMENT AND PLAN:   Principal Problem:   Dementia Active Problems:   AKI (acute kidney injury) ([place])   Acute delirium   Pressure injury of skin   Protein-calorie malnutrition, severe  * Acute kidney injury due to severe dehydration from poor oral intake.     Continue maintenance fluids.  Monitor input and output.  Repeat labs in the morning.   CK not high. Stop torsemide.  *Hypertension.  Will discontinue hydrochlorothiazide due to dehydration.  Labetalol recently stopped due to bradycardia.  We will continue amlodipine.  * hypokalemia   Replace oral and IV   MG normal.  * hyperkalemia   Now K is high, stop replacement, Pt had today morning labs late- as she  refused early morning. By that time, she have received today's dose for KCL. Will give IV fluid and re-check  tomorrow.  *Elevated troponin of 0.13.  No chest pain or shortness of breath.  stable repeat troponin.  *Dementia with poor oral intake.  palliative care consult due to recurrent admissions for the same problem.  *DVT prophylaxis with heparin  All the records are reviewed and case discussed with Care Management/Social Workerr. Management plans discussed with the patient, family and they are in agreement.  CODE STATUS: full.  TOTAL TIME TAKING CARE OF THIS PATIENT: 35 minutes.  I spoke to her daughter in room today, for last few months, pt have decreased appetite and activities. She will be willing to make her DNR, but want to discuss that with her sister also in the soon to happen meeting with palliative care nurse.  POSSIBLE D/C IN 1-2 DAYS, DEPENDING ON CLINICAL CONDITION.   Vaughan Basta M.D on 01/01/2018   Between 7am to 6pm - Pager - [phone]  After 6pm go to [email or web address] - password [email or web address] [facility]  Office  [phone]  CC: Primary care physician; Kirk Ruths, MD  Note: This dictation was prepared with Dragon dictation along with smaller phrase technology. Any transcriptional errors that result from this process are unintentional.

## 2018-01-01 NOTE — Progress Notes (Signed)
PT Cancellation Note  Patient Details Name: Kimberly Bartlett MRN: [id number] DOB: 05-30-1928   Cancelled Treatment:    Reason Eval/Treat Not Completed: Medical issues which prohibited therapy; Pt's Ka 2.7 falling outside guidelines for participation with PT services.  Will attempt to see pt at a future date/time as medically appropriate.       Linus Salmons PT, DPT 01/01/18, 10:10 AM

## 2018-01-01 NOTE — Plan of Care (Signed)
  Problem: Clinical Measurements: Goal: Ability to maintain clinical measurements within normal limits will improve Outcome: Progressing Goal: Will remain free from infection Outcome: Progressing Goal: Diagnostic test results will improve Outcome: Progressing Goal: Respiratory complications will improve Outcome: Progressing Goal: Cardiovascular complication will be avoided Outcome: Progressing   Problem: Coping: Goal: Level of anxiety will decrease Outcome: Progressing   Problem: Elimination: Goal: Will not experience complications related to urinary retention Outcome: Progressing   Problem: Pain Managment: Goal: General experience of comfort will improve Outcome: Progressing

## 2018-01-01 NOTE — Progress Notes (Signed)
PT Cancellation Note  Patient Details Name: Kimberly Bartlett MRN: [id number] DOB: [birth year]   Cancelled Treatment:    Reason Eval/Treat Not Completed: Medical issues which prohibited therapy; Pt's Ka 5.3 falling outside guidelines for participation with PT services.  Per Dr. Anselm Jungling, please hold PT services this date and check values tomorrow with attempt to see pt tomorrow morning as medically appropriate.     Linus Salmons PT, DPT 01/01/18, 1:00 PM

## 2018-01-02 LAB — BASIC METABOLIC PANEL
Anion gap: 4 — ABNORMAL LOW (ref 5–15)
BUN: 28 mg/dL — AB (ref 8–23)
CO2: 27 mmol/L (ref 22–32)
CREATININE: 0.87 mg/dL (ref 0.44–1.00)
Calcium: 10.1 mg/dL (ref 8.9–10.3)
Chloride: 108 mmol/L (ref 98–111)
GFR calc Af Amer: >60 mL/min (ref >60)
GFR, EST NON AFRICAN AMERICAN: 58 mL/min — AB (ref >60)
Glucose, Bld: 106 mg/dL — ABNORMAL HIGH (ref 70–99)
POTASSIUM: 5.1 mmol/L (ref 3.5–5.1)
SODIUM: 139 mmol/L (ref 135–145)

## 2018-01-02 NOTE — Care Management Important Message (Signed)
Important Message  Patient Details  Name: Kimberly Bartlett MRN: [id number] Date of Birth: 06-04-1928   Medicare Important Message Given:  Yes    [name] 01/02/2018, 2:37 PM

## 2018-01-02 NOTE — Consult Note (Signed)
[facility] Consult   Reason for Consult: Follow-up consult for this 84 year old woman who is in the hospital for altered mental status and malnutrition with renal failure.  Follow-up concerns about some of the behavior she has at home Referring Physician: Anselm Jungling Patient Identification: Kimberly Bartlett MRN:  [id number] Principal Diagnosis: Dementia Diagnosis:   Patient Active Problem List   Diagnosis Date Noted  . Pressure injury of skin [L89.90] 12/31/2017  . Protein-calorie malnutrition, severe [E43] 12/31/2017  . AKI (acute kidney injury) ([place]) [N17.9] 12/30/2017  . Dementia [F03.90] 12/30/2017  . Acute delirium [R41.0] 12/30/2017  . Acute kidney injury ([place]) [N17.9] 11/15/2017    Total Time spent with patient: 30 minutes  Subjective:   Kimberly Bartlett is a 84 y.o. female patient admitted with "I believe this is a place for people who have a problem with their mind".  HPI: Patient seen chart reviewed.  Spoke with hospitalist.  Patient was seen a couple days ago as well.  I was asked to reconsult because the family expressed concerns about how the patient is living at home.  Specifically it sounds like the daughter was concerned that the patient has a tendency to spend money irresponsibly or excessively and by things that frequently need to be returned.  I found the patient this evening awake and cooperative and in better frame of mind than she was a couple days ago.  Patient was generally oriented to the fact that she was in a facility although she has no idea why she is in the hospital.  She gasped several times that she was here because of problems with her memory and could not tell me anything about her medical situation.  Patient has a fair bit of recall still for some events earlier in her life but her short-term memory remains very poor.  Could not remember 3 objects after just a couple minutes.  Did not remember parts of our conversation as we went along.  Patient did  not report any symptoms of depression.  No sign of hopelessness or negativity.  No suicidal thoughts.  We talked about the family's concern regarding her spending.  Patient said that she was aware that her daughter had that complaint about her but that she herself did not agree with it.  She had a rambling explanation about how sometimes he went to [facility] and found that they had useful things.  She went on to express a belief that family members were sometimes stealing her money.  I tried to engage her in understanding that since she knows she has memory problems would not make sense that she can no longer manage her money but the patient refused to follow that line of logic.  Social history: Patient continues to live independently although it sounds like a daughter either stays with her part-time or checks in regularly or may be staying there most of the time now.  Medical history: Severe high blood pressure but otherwise not much in the way of chronic medical problems.  Recently seems to be malnourished and had some acute kidney injury  Substance abuse history: None  Past Psychiatric History: Established past history of dementia as noted previously but no other psychiatric illness.  Patient denied ever seeing a psychiatrist herself or any treatment or having any history of depression.  Nothing in the chart about anything other than her known history of memory problems.  Risk to Self:   Risk to Others:   Prior Inpatient Therapy:  Prior Outpatient Therapy:    Past Medical History:  Past Medical History:  Diagnosis Date  . Breast cancer ([place])   . Dementia   . Dislocated shoulder, right, sequela   . Glaucoma    both eyes  . Hypertension     Past Surgical History:  Procedure Laterality Date  . ABDOMINAL HYSTERECTOMY    . BREAST SURGERY     bialteral mastectomy  . EYE SURGERY     bilateral cataracts  . hammer toe Bilateral   . heel spur Bilateral   . JOINT REPLACEMENT     bilateral  knees  . MASTECTOMY     bilateral   Family History:  Family History  Problem Relation Age of Onset  . Hypertension Mother    Family Psychiatric  History: Unknown Social History:  Social History   Substance and Sexual Activity  Alcohol Use Yes   Comment: occassional wine     Social History   Substance and Sexual Activity  Drug Use No    Social History   Socioeconomic History  . Marital status: Widowed    Spouse name: Not on file  . Number of children: Not on file  . Years of education: Not on file  . Highest education level: Not on file  Occupational History  . Not on file  Social Needs  . Financial resource strain: Not on file  . Food insecurity:    Worry: Never true    Inability: Never true  . Transportation needs:    Medical: No    Non-medical: No  Tobacco Use  . Smoking status: Former Smoker    Types: Cigarettes    Last attempt to quit: 11/05/1948    Years since quitting: 69.2  . Smokeless tobacco: Never Used  Substance and Sexual Activity  . Alcohol use: Yes    Comment: occassional wine  . Drug use: No  . Sexual activity: Not on file  Lifestyle  . Physical activity:    Days per week: 0 days    Minutes per session: Not on file  . Stress: Only a little  Relationships  . Social connections:    Talks on phone: More than three times a week    Gets together: More than three times a week    Attends religious service: Not on file    Active member of club or organization: Not on file    Attends meetings of clubs or organizations: More than 4 times per year    Relationship status: Widowed  Other Topics Concern  . Not on file  Social History Narrative  . Not on file   Additional Social History:    Allergies:  No Known Allergies  Labs:  Results for orders placed or performed during the hospital encounter of 12/30/17 (from the past 48 hour(s))  Basic metabolic panel     Status: Abnormal   Collection Time: 01/01/18  9:33 AM  Result Value Ref Range    Sodium 146 (H) 135 - 145 mmol/L   Potassium 5.3 (H) 3.5 - 5.1 mmol/L   Chloride 113 (H) 98 - 111 mmol/L   CO2 28 22 - 32 mmol/L   Glucose, Bld 104 (H) 70 - 99 mg/dL   BUN 44 (H) 8 - 23 mg/dL   Creatinine, Ser 1.00 0.44 - 1.00 mg/dL   Calcium 10.5 (H) 8.9 - 10.3 mg/dL   GFR calc non Af Amer 49 (L) >60 mL/min   GFR calc Af Amer 57 (L) >60 mL/min  Comment: (NOTE) The eGFR has been calculated using the CKD EPI equation. This calculation has not been validated in all clinical situations. eGFR's persistently <60 mL/min signify possible Chronic Kidney Disease.    Anion gap 5 5 - 15    Comment: Performed at [facility], [address] [place], [place] [address]  Basic metabolic panel     Status: Abnormal   Collection Time: 01/02/18  5:21 AM  Result Value Ref Range   Sodium 139 135 - 145 mmol/L   Potassium 5.1 3.5 - 5.1 mmol/L   Chloride 108 98 - 111 mmol/L   CO2 27 22 - 32 mmol/L   Glucose, Bld 106 (H) 70 - 99 mg/dL   BUN 28 (H) 8 - 23 mg/dL   Creatinine, Ser 0.87 0.44 - 1.00 mg/dL   Calcium 10.1 8.9 - 10.3 mg/dL   GFR calc non Af Amer 58 (L) >60 mL/min   GFR calc Af Amer >60 >60 mL/min    Comment: (NOTE) The eGFR has been calculated using the CKD EPI equation. This calculation has not been validated in all clinical situations. eGFR's persistently <60 mL/min signify possible Chronic Kidney Disease.    Anion gap 4 (L) 5 - 15    Comment: Performed at [facility], [address] [place],  [address]    Current Facility-Administered Medications  Medication Dose Route Frequency Provider Last Rate Last Dose  . acetaminophen (TYLENOL) tablet 650 mg  650 mg Oral Q6H PRN Hillary Bow, MD   650 mg at 01/01/18 1553   Or  . acetaminophen (TYLENOL) suppository 650 mg  650 mg Rectal Q6H PRN Sudini, Alveta Heimlich, MD      . albuterol (PROVENTIL) (2.5 MG/3ML) 0.083% nebulizer solution 2.5 mg  2.5 mg Nebulization Q2H PRN Sudini, Srikar, MD      . amLODipine (NORVASC)  tablet 5 mg  5 mg Oral Daily Hillary Bow, MD   5 mg at 01/02/18 0937  . colchicine tablet 0.6 mg  0.6 mg Oral Daily Hillary Bow, MD   0.6 mg at 01/02/18 9767   And  . probenecid (BENEMID) tablet 500 mg  500 mg Oral Daily Hillary Bow, MD   500 mg at 01/02/18 0937  . feeding supplement (ENSURE ENLIVE) (ENSURE ENLIVE) liquid 237 mL  237 mL Oral BID BM Vaughan Basta, MD   237 mL at 01/02/18 0936  . gabapentin (NEURONTIN) capsule 100 mg  100 mg Oral BID Hillary Bow, MD   100 mg at 01/02/18 0937  . heparin injection 5,000 Units  5,000 Units Subcutaneous Q8H Hillary Bow, MD   5,000 Units at 01/01/18 2108  . latanoprost (XALATAN) 0.005 % ophthalmic solution 1 drop  1 drop Both Eyes QHS Hillary Bow, MD   1 drop at 01/01/18 2108  . multivitamin-lutein (OCUVITE-LUTEIN) capsule 1 capsule  1 capsule Oral Daily Vaughan Basta, MD   1 capsule at 01/02/18 0937  . ondansetron (ZOFRAN) tablet 4 mg  4 mg Oral Q6H PRN Hillary Bow, MD       Or  . ondansetron (ZOFRAN) injection 4 mg  4 mg Intravenous Q6H PRN Sudini, Srikar, MD      . polyethylene glycol (MIRALAX / GLYCOLAX) packet 17 g  17 g Oral Daily PRN Sudini, Srikar, MD      . sodium chloride 0.9 % bolus 1,000 mL  1,000 mL Intravenous Once Hillary Bow, MD        Musculoskeletal: Strength & Muscle Tone: decreased Gait & Station: unsteady  Patient leans: N/A  Psychiatric Specialty Exam: Physical Exam  Nursing note and vitals reviewed. Constitutional: She appears well-developed and well-nourished.  HENT:  Head: Normocephalic and atraumatic.  Eyes: Pupils are equal, round, and reactive to light. Conjunctivae are normal.  Neck: Normal range of motion.  Cardiovascular: Regular rhythm and normal heart sounds.  Respiratory: Effort normal. No respiratory distress.  GI: Soft.  Musculoskeletal: Normal range of motion.  Neurological: She is alert.  Skin: Skin is warm and dry.  Psychiatric: She has a normal mood and  affect. Her speech is delayed. She is slowed. Cognition and memory are impaired. She expresses inappropriate judgment. She expresses no homicidal and no suicidal ideation. She exhibits abnormal recent memory.    Review of Systems  Constitutional: Negative.   HENT: Negative.   Eyes: Negative.   Respiratory: Negative.   Cardiovascular: Negative.   Gastrointestinal: Negative.   Musculoskeletal: Negative.   Skin: Negative.   Neurological: Negative.   Psychiatric/Behavioral: Positive for memory loss. Negative for depression, hallucinations, substance abuse and suicidal ideas. The patient is not nervous/anxious and does not have insomnia.     Blood pressure 132/79, pulse 63, temperature 99.1 F (37.3 C), temperature source Oral, resp. rate (!) 22, height 5' 3" (1.6 m), weight 73.9 kg (162 lb 14.7 oz), SpO2 100 %.Body mass index is 28.86 kg/m.  General Appearance: Fairly Groomed  Eye Contact:  Fair  Speech:  Slow  Volume:  Decreased  Mood:  Euthymic  Affect:  Constricted  Thought Process:  Disorganized  Orientation:  Negative  Thought Content:  Tangential  Suicidal Thoughts:  No  Homicidal Thoughts:  No  Memory:  Immediate;   Fair Recent;   Poor Remote;   Poor  Judgement:  Impaired  Insight:  Shallow  Psychomotor Activity:  Decreased  Concentration:  Concentration: Poor  Recall:  Poor  Fund of Knowledge:  Fair  Language:  Fair  Akathisia:  No  Handed:  Right  AIMS (if indicated):     Assets:  Desire for Improvement Housing Social Support  ADL's:  Impaired  Cognition:  Impaired,  Mild and Moderate  Sleep:        Treatment Plan Summary: Plan Patient reevaluated.  She is certainly no longer delirious as she was a couple days ago.  Nevertheless she is still markedly demented.  She is able to carry on a conversation but she confabulates certain elements and clearly has very little short-term memory.  She is able to show some insight superficially that she has memory problems  but not to think through the consequences of that.  Unfortunately she also has some of the paranoia you sometimes see in demented people particularly regarding money.  He is not able to manage the insight to see that her fear that people are stealing from her may simply represent her own inability to manage money.  Patient does not show any signs of having a depression.  No signs of mania.  The sort of paranoia that she is having does not rise to the point that I would call at psychotic and does not warrant treatment with antipsychotics because the risk would far outweigh the benefit.  Patient herself admits that she buys things but does not think that any of it is excessive.  There is no sign that she has OCD or excessive anxiety.  I think any of the behavior problems she is having are simply a result of her dementia.  She cannot reasonably think through and plan spending  when her memory is as bad as it is.  Unfortunately there is no medication that is likely to make much of a difference in this behavior.  Ideally the family would get the patient to relinquish some control of her finances particularly if the patient's behavior is putting herself in danger.  If she truly is standing in the way of her own safety then I would encourage the family to consider petitioning for guardianship so that they could take control of the situation.  In the meantime there is no indication that any medicine or specific intervention here in the hospital is going to change the situation.  No further direct intervention at this point.  Disposition: No evidence of imminent risk to self or others at present.   Patient does not meet criteria for psychiatric inpatient admission. Supportive therapy provided about ongoing stressors.  Alethia Berthold, MD 01/02/2018 9:08 PM

## 2018-01-02 NOTE — Evaluation (Signed)
Physical Therapy Evaluation Patient Details Name: Kimberly Bartlett MRN: [id number] DOB: [birth year] Today's Date: 01/02/2018   History of Present Illness  Pt is an 84 y.o. female with a known history of hypertension, glaucoma, dementia, breast cancer who was recently in the hospital for rhabdomyolysis, acute kidney injury returns from home with weakness, not eating and drinking well.  Pt reported feeling extremely weak like she is shutting down.  Her BUN was greater than 100 and creatinine 2.  Assessment includes: Acute kidney injury due to severe dehydration from poor oral intake, HTN, hypokalemia progressing to hyperkalemia, elevated troponin, and dementia.      Clinical Impression  Pt presents with mild deficits in strength, transfers, gait, and activity tolerance but performed well overall.  Pt required extra time and effort during bed mobility tasks but no physical assistance.  Pt was steady with good control during transfers and was able to amb 30' with a RW and SBA with slow cadence, flexed trunk posture, and short B step length but was steady without LOB.  Pt reports feeling somewhat weaker than her baseline and will benefit from HHPT services upon discharge to safely address above deficits for return to PLOF and decreased risk of further functional decline.      Follow Up Recommendations Home health PT;Supervision for mobility/OOB    Equipment Recommendations  Rolling walker with 5" wheels    Recommendations for Other Services       Precautions / Restrictions Precautions Precautions: Fall Restrictions Weight Bearing Restrictions: No      Mobility  Bed Mobility Overal bed mobility: Modified Independent             General bed mobility comments: Extra time and effort but no physical assistance required with bed mobility tasks  Transfers Overall transfer level: Needs assistance Equipment used: Rolling walker (2 wheeled) Transfers: Sit to/from Stand Sit to Stand:  Supervision         General transfer comment: Good control and stability during sit to/from stand transfers  Ambulation/Gait Ambulation/Gait assistance: Supervision Gait Distance (Feet): 30 Feet Assistive device: Rolling walker (2 wheeled) Gait Pattern/deviations: Step-through pattern;Decreased step length - right;Decreased step length - left;Trunk flexed Gait velocity: Decreased   General Gait Details: Slow cadence and short B step length during amb but no instability noted  Stairs            Wheelchair Mobility    Modified Rankin (Stroke Patients Only)       Balance Overall balance assessment: No apparent balance deficits (not formally assessed)                                           Pertinent Vitals/Pain Pain Assessment: No/denies pain    Home Living Family/patient expects to be discharged to:: Private residence Living Arrangements: Children Available Help at Discharge: Family;Available 24 hours/day Type of Home: House Home Access: Stairs to enter Entrance Stairs-Rails: Right Entrance Stairs-Number of Steps: 2 Home Layout: One level Home Equipment: None      Prior Function Level of Independence: Independent         Comments: Ind Amb without AD with no fall history, Ind with ADLs     Hand Dominance        Extremity/Trunk Assessment   Upper Extremity Assessment Upper Extremity Assessment: Overall WFL for tasks assessed    Lower Extremity Assessment Lower Extremity Assessment: Generalized weakness  Communication   Communication: No difficulties  Cognition Arousal/Alertness: Awake/alert Behavior During Therapy: WFL for tasks assessed/performed Overall Cognitive Status: No family/caregiver present to determine baseline cognitive functioning                                        General Comments      Exercises Total Joint Exercises Ankle Circles/Pumps: AROM;Both;10 reps Quad Sets:  Strengthening;Both;10 reps Gluteal Sets: Strengthening;Both;10 reps Heel Slides: AROM;Both;5 reps Hip ABduction/ADduction: AROM;Both;5 reps Straight Leg Raises: AROM;Both;5 reps Long Arc Quad: AROM;Both;10 reps Knee Flexion: AROM;Both;10 reps Marching in Standing: AROM;Both;10 reps;Standing   Assessment/Plan    PT Assessment Patient needs continued PT services  PT Problem List Decreased strength;Decreased activity tolerance       PT Treatment Interventions DME instruction;Gait training;Stair training;Functional mobility training;Balance training;Patient/family education;Therapeutic activities;Therapeutic exercise    PT Goals (Current goals can be found in the Care Plan section)  Acute Rehab PT Goals Patient Stated Goal: To return to prior living situation PT Goal Formulation: With patient Time For Goal Achievement: 01/15/18 Potential to Achieve Goals: Good    Frequency Min 2X/week   Barriers to discharge        Co-evaluation               AM-PAC PT "6 Clicks" Daily Activity  Outcome Measure Difficulty turning over in bed (including adjusting bedclothes, sheets and blankets)?: A Little Difficulty moving from lying on back to sitting on the side of the bed? : A Little Difficulty sitting down on and standing up from a chair with arms (e.g., wheelchair, bedside commode, etc,.)?: None Help needed moving to and from a bed to chair (including a wheelchair)?: None Help needed walking in hospital room?: None Help needed climbing 3-5 steps with a railing? : A Little 6 Click Score: 21    End of Session Equipment Utilized During Treatment: Gait belt Activity Tolerance: Patient tolerated treatment well Patient left: in bed;with call bell/phone within reach;with bed alarm set;Other (comment)(Prevalon boot to L foot) Nurse Communication: Mobility status PT Visit Diagnosis: Muscle weakness (generalized) (M62.81);Difficulty in walking, not elsewhere classified (R26.2)    Time:  1035-1100 PT Time Calculation (min) (ACUTE ONLY): 25 min   Charges:   PT Evaluation $PT Eval Low Complexity: 1 Low PT Treatments $Therapeutic Exercise: 8-22 mins        D. Scott Abshir Paolini PT, DPT 01/02/18, 11:24 AM

## 2018-01-02 NOTE — Care Management Important Message (Signed)
Important Message  Patient Details  Name: Kimberly Bartlett MRN: [id number] Date of Birth: 1928/02/14   Medicare Important Message Given:  Yes    Juliann Pulse A Idalys Konecny 01/02/2018, 2:52 PM

## 2018-01-02 NOTE — Progress Notes (Signed)
[facility] [place] NAME: Kimberly Bartlett    MR#:  [id number]  DATE OF BIRTH:  April 22, 1928  SUBJECTIVE:  CHIEF COMPLAINT:   Chief Complaint  Patient presents with  . Weakness   Brought with generalized weakness and dehydration. Also has some dementia. Appears comfortable today renal function better,   REVIEW OF SYSTEMS:  CONSTITUTIONAL: No fever, have fatigue or weakness.  EYES: No blurred or double vision.  EARS, NOSE, AND THROAT: No tinnitus or ear pain.  RESPIRATORY: No cough, shortness of breath, wheezing or hemoptysis.  CARDIOVASCULAR: No chest pain, orthopnea, edema.  GASTROINTESTINAL: No nausea, vomiting, diarrhea or abdominal pain.  GENITOURINARY: No dysuria, hematuria.  ENDOCRINE: No polyuria, nocturia,  HEMATOLOGY: No anemia, easy bruising or bleeding SKIN: No rash or lesion. MUSCULOSKELETAL: No joint pain or arthritis.   NEUROLOGIC: No tingling, numbness, weakness.  PSYCHIATRY: No anxiety or depression.   ROS  DRUG ALLERGIES:  No Known Allergies  VITALS:  Blood pressure 132/79, pulse 63, temperature 99.1 F (37.3 C), temperature source Oral, resp. rate (!) 22, height 5\' 3"  (1.6 m), weight 73.9 kg (162 lb 14.7 oz), SpO2 100 %.  PHYSICAL EXAMINATION:  GENERAL:  84 y.o.-year-old patient lying in the bed with no acute distress.  EYES: Pupils equal, round, reactive to light and accommodation. No scleral icterus. Extraocular muscles intact.  HEENT: Head atraumatic, normocephalic. Oropharynx and nasopharynx clear.  NECK:  Supple, no jugular venous distention. No thyroid enlargement, no tenderness.  LUNGS: Normal breath sounds bilaterally, no wheezing, rales,rhonchi or crepitation. No use of accessory muscles of respiration.  CARDIOVASCULAR: S1, S2 normal. No murmurs, rubs, or gallops.  ABDOMEN: Soft, nontender, nondistended. Bowel sounds present. No organomegaly or mass.  EXTREMITIES: No pedal edema, cyanosis, or clubbing.   NEUROLOGIC: Cranial nerves II through XII are intact. Muscle strength 4/5 in all extremities. Sensation intact. Gait not checked.  PSYCHIATRIC: The patient is alert and oriented x 2.  SKIN: No obvious rash, lesion, or ulcer.   Physical Exam LABORATORY PANEL:   CBC Recent Labs  Lab 12/31/17 0411  WBC 5.3  HGB 11.2*  HCT 32.6*  PLT 276   ------------------------------------------------------------------------------------------------------------------  Chemistries  Recent Labs  Lab 12/31/17 0411  01/02/18 0521  NA 141   < > 139  K 2.7*   < > 5.1  CL 100   < > 108  CO2 32   < > 27  GLUCOSE 109*   < > 106*  BUN 96*   < > 28*  CREATININE 1.22*   < > 0.87  CALCIUM 10.2   < > 10.1  MG 2.5*  --   --   AST 22  --   --   ALT 11  --   --   ALKPHOS 35*  --   --   BILITOT 1.0  --   --    < > = values in this interval not displayed.   ------------------------------------------------------------------------------------------------------------------  Cardiac Enzymes Recent Labs  Lab 12/30/17 2245 12/31/17 0411  TROPONINI 0.16* 0.16*   ------------------------------------------------------------------------------------------------------------------  RADIOLOGY:  No results found.  ASSESSMENT AND PLAN:   Principal Problem:   Dementia Active Problems:   AKI (acute kidney injury) ([place])   Acute delirium   Pressure injury of skin   Protein-calorie malnutrition, severe  * Acute kidney injury due to severe dehydration from poor oral intake.     Continue maintenance fluids.  Monitor input and output.  Repeat labs in the morning.  CK not high. Stop torsemide.    Renal func improved.  *Hypertension.  Will discontinue hydrochlorothiazide due to dehydration.  Labetalol recently stopped due to bradycardia.  We will continue amlodipine. Stable now.  * hypokalemia   Replace oral and IV   MG normal.  * hyperkalemia   Now K is high, stop replacement,    Coming down on  recheck this morning.  *Elevated troponin of 0.13.  No chest pain or shortness of breath.  stable repeat troponin.  *Dementia with poor oral intake.  palliative care consult due to recurrent admissions for the same problem.  *DVT prophylaxis with heparin  * behaviour issues   As per daughter, pt does excessive shopping, her house and closets are stuffed. She does not pay her bills. Does not eat / drink and argue with daughter on being told about this.    Due to this, she is at high risk of recurrent admission for same, so I have requested psych re-eval as earlier when they saw- pt was confused and drowsy.  All the records are reviewed and case discussed with Care Management/Social Workerr. Management plans discussed with the patient, family and they are in agreement.  CODE STATUS: full.  TOTAL TIME TAKING CARE OF THIS PATIENT: 35 minutes.    POSSIBLE D/C IN 1-2 DAYS, DEPENDING ON CLINICAL CONDITION.   Vaughan Basta M.D on 01/02/2018   Between 7am to 6pm - Pager - [phone]  After 6pm go to [email or web address] - password [email or web address] [facility]  Office  [phone]  CC: Primary care physician; Kirk Ruths, MD  Note: This dictation was prepared with Dragon dictation along with smaller phrase technology. Any transcriptional errors that result from this process are unintentional.

## 2018-01-03 MED ORDER — ENSURE ENLIVE PO LIQD: 237.0000 mL | Freq: Two times a day (BID) | ORAL | 12 refills | Status: AC

## 2018-01-03 MED ORDER — POLYETHYLENE GLYCOL 3350 17 G PO PACK: 17.0000 g | PACK | Freq: Every day | ORAL | 0 refills | Status: AC | PRN

## 2018-01-03 MED ORDER — OCUVITE-LUTEIN PO CAPS: 1.0000 | ORAL_CAPSULE | Freq: Every day | ORAL | 0 refills | Status: AC

## 2018-01-03 NOTE — Progress Notes (Signed)
MD order received in [facility] to discharge pt home with home health services and Palliative Care services today; Care Management previously set up [place] services for the pt; verbally reviewed AVS with pt and pt's daughter, Rxs electronically submitted; no questions voiced at this time; pt discharged via wheelchair by auxillary to the visitor's entrance

## 2018-01-03 NOTE — Care Management Note (Signed)
Case Management Note  Patient Details  Name: Kimberly Bartlett MRN: [id number] Date of Birth: [birth year]  Subjective/Objective:   Patient to be discharged per MD order. Orders in place for palliative care and home health services. Per daughter, patient is currently set up with [facility] home health and they would like to resume care with [facility]. Referral placed with [place] who states patient is open with PT,RN,OT and aide services. Will resume all of these. Daughter and patient also in agreement for outpatient palliative care, per preference will fax information to [facility]. Family to provide transport home.  Ines Bloomer RN BSN RNCM [phone]                   Action/Plan:   Expected Discharge Date:  01/03/18               Expected Discharge Plan:     In-House Referral:  Hospice / Palliative Care  Discharge planning Services  CM Consult  Post Acute Care Choice:  Resumption of Svcs/PTA Provider, Home Health Choice offered to:  Patient, Adult Children  DME Arranged:    DME Agency:     HH Arranged:  RN, PT, OT, Nurse's Aide [place] Agency:  [facility]  Status of Service:  Completed, signed off  If discussed at [place] of Stay Meetings, dates discussed:    Additional Comments:  Latanya Maudlin, RN 01/03/2018, 1:59 PM

## 2018-01-03 NOTE — Discharge Summary (Signed)
Kimberly Bartlett NAME: Kimberly Bartlett    MR#:  [id number]  DATE OF BIRTH:  [birth year]  DATE OF ADMISSION:  12/30/2017 ADMITTING PHYSICIAN: Hillary Bow, MD  DATE OF DISCHARGE: 01/03/2018   PRIMARY CARE PHYSICIAN: Kirk Ruths, MD    ADMISSION DIAGNOSIS:  Dehydration [E86.0] AKI (acute kidney injury) ([place]) [N17.9]  DISCHARGE DIAGNOSIS:  Principal Problem:   Dementia Active Problems:   AKI (acute kidney injury) ([place])    Protein-calorie malnutrition, severe   SECONDARY DIAGNOSIS:   Past Medical History:  Diagnosis Date  . Breast cancer ([place])   . Dementia   . Dislocated shoulder, right, sequela   . Glaucoma    both eyes  . Hypertension     HOSPITAL COURSE:   *Acute kidney injury due to severe dehydration from poor oral intake.   Continue maintenance fluids. Monitor input and output. Repeat labs in the morning.  CK not high.Stop torsemide.    Renal func improved.  *Hypertension. Will discontinue hydrochlorothiazide due to dehydration. Labetalol recently stopped due to bradycardia. We will continue amlodipine. Stable now.  * hypokalemia   Replace oral and IV   MG normal.  * hyperkalemia   Now K is high, stop replacement,    Coming down on recheck this morning.  *Elevated troponin of 0.13. No chest pain or shortness of breath. stable repeat troponin.  *Dementia with poor oral intake.  palliative care consult due to recurrent admissions for the same problem.  also seen by Psych, no meds added.  *DVT prophylaxis with heparin  * behaviour issues   As per daughter, pt does excessive shopping, her house and closets are stuffed. She does not pay her bills. Does not eat / drink and argue with daughter on being told about this.    Due to this, she is at high risk of recurrent admission for same, so I have requested psych re-eval as earlier when they saw- pt was confused and drowsy. As per  psych- she have dementia, and no changes suggested than family support.   DISCHARGE CONDITIONS:   Stable.  CONSULTS OBTAINED:  Treatment Team:  Clapacs, Madie Reno, MD Hillary Bow, MD  DRUG ALLERGIES:  No Known Allergies  DISCHARGE MEDICATIONS:   Allergies as of 01/03/2018   No Known Allergies     Medication List    STOP taking these medications   labetalol 100 MG tablet Commonly known as:  NORMODYNE   losartan-hydrochlorothiazide 100-25 MG tablet Commonly known as:  HYZAAR   torsemide 10 MG tablet Commonly known as:  DEMADEX     TAKE these medications   amLODipine 5 MG tablet Commonly known as:  NORVASC Take 5 mg by mouth daily.   bimatoprost 0.01 % Soln Commonly known as:  LUMIGAN Place 1 drop into both eyes at bedtime.   colchicine-probenecid 0.5-500 MG tablet Take 1 tablet by mouth daily.   cyanocobalamin 1000 MCG/ML injection Commonly known as:  (VITAMIN B-12) Inject 1 mL into the muscle every 30 (thirty) days.   feeding supplement (ENSURE ENLIVE) Liqd Take 237 mLs by mouth 2 (two) times daily between meals.   gabapentin 100 MG capsule Commonly known as:  NEURONTIN Take 100 mg by mouth 2 (two) times daily.   multivitamin-lutein Caps capsule Take 1 capsule by mouth daily. Start taking on:  01/04/2018   polyethylene glycol packet Commonly known as:  MIRALAX / GLYCOLAX Take 17 g by mouth daily as needed for mild constipation.  Vitamin D 2000 units tablet Take 2,000 Units by mouth daily.        DISCHARGE INSTRUCTIONS:    Follow with PMD in 1-2 weeks.  If you experience worsening of your admission symptoms, develop shortness of breath, life threatening emergency, suicidal or homicidal thoughts you must seek medical attention immediately by calling 911 or calling your MD immediately  if symptoms less severe.  You Must read complete instructions/literature along with all the possible adverse reactions/side effects for all the Medicines you  take and that have been prescribed to you. Take any new Medicines after you have completely understood and accept all the possible adverse reactions/side effects.   Please note  You were cared for by a hospitalist during your hospital stay. If you have any questions about your discharge medications or the care you received while you were in the hospital after you are discharged, you can call the unit and asked to speak with the hospitalist on call if the hospitalist that took care of you is not available. Once you are discharged, your primary care physician will handle any further medical issues. Please note that NO REFILLS for any discharge medications will be authorized once you are discharged, as it is imperative that you return to your primary care physician (or establish a relationship with a primary care physician if you do not have one) for your aftercare needs so that they can reassess your need for medications and monitor your lab values.    Today   CHIEF COMPLAINT:   Chief Complaint  Patient presents with  . Weakness    HISTORY OF PRESENT ILLNESS:  Kimberly Bartlett  is a 84 y.o. female with a known history of hypertension, glaucoma, dementia, breast cancer who was recently in the hospital for rhabdomyolysis, acute kidney injury returns from home with weakness, not eating and drinking well.  Daughter at bedside contributes to most of the history.  Patient is oriented to place but not time.  She is waiting for a neurology consultation for her memory problems with her first appointment being tomorrow at 1 PM. Patient tells me that she just does not feel like eating.  Feels extremely weak like she is shutting down.  Her BUN is greater than 100 and creatinine 2. Patient was in the hospital last month for rhabdomyolysis, acute kidney injury, weakness.  Treated with IV fluids with CK improving.  Labetalol dose decreased due to mild bradycardia.  This has been stopped due to hypotension by her PCP  who she saw on Thursday.    VITAL SIGNS:  Blood pressure (!) 145/63, pulse (!) 58, temperature 99.1 F (37.3 C), temperature source Oral, resp. rate 17, height 5\' 3"  (1.6 m), weight 73.9 kg (162 lb 14.7 oz), SpO2 100 %.  I/O:    Intake/Output Summary (Last 24 hours) at 01/03/2018 1216 Last data filed at 01/03/2018 0143 Gross per 24 hour  Intake 240 ml  Output 1 ml  Net 239 ml    PHYSICAL EXAMINATION:   GENERAL:  84 y.o.-year-old patient lying in the bed with no acute distress.  EYES: Pupils equal, round, reactive to light and accommodation. No scleral icterus. Extraocular muscles intact.  HEENT: Head atraumatic, normocephalic. Oropharynx and nasopharynx clear.  NECK:  Supple, no jugular venous distention. No thyroid enlargement, no tenderness.  LUNGS: Normal breath sounds bilaterally, no wheezing, rales,rhonchi or crepitation. No use of accessory muscles of respiration.  CARDIOVASCULAR: S1, S2 normal. No murmurs, rubs, or gallops.  ABDOMEN: Soft, nontender, nondistended.  Bowel sounds present. No organomegaly or mass.  EXTREMITIES: No pedal edema, cyanosis, or clubbing.  NEUROLOGIC: Cranial nerves II through XII are intact. Muscle strength 4/5 in all extremities. Sensation intact. Gait not checked.  PSYCHIATRIC: The patient is alert and oriented x 2.  SKIN: No obvious rash, lesion, or ulcer    DATA REVIEW:   CBC Recent Labs  Lab 12/31/17 0411  WBC 5.3  HGB 11.2*  HCT 32.6*  PLT 276    Chemistries  Recent Labs  Lab 12/31/17 0411  01/02/18 0521  NA 141   < > 139  K 2.7*   < > 5.1  CL 100   < > 108  CO2 32   < > 27  GLUCOSE 109*   < > 106*  BUN 96*   < > 28*  CREATININE 1.22*   < > 0.87  CALCIUM 10.2   < > 10.1  MG 2.5*  --   --   AST 22  --   --   ALT 11  --   --   ALKPHOS 35*  --   --   BILITOT 1.0  --   --    < > = values in this interval not displayed.    Cardiac Enzymes Recent Labs  Lab 12/31/17 0411  TROPONINI 0.16*    Microbiology Results   Results for orders placed or performed during the hospital encounter of 11/15/17  Urine Culture     Status: Abnormal   Collection Time: 11/15/17  4:28 PM  Result Value Ref Range Status   Specimen Description   Final    URINE, RANDOM Performed at [facility], [address] [place], [place] [address]    Special Requests   Final    Normal Performed at [facility], [address] [place], [place] [address]    Culture MULTIPLE SPECIES PRESENT, SUGGEST RECOLLECTION (A)  Final   Report Status 11/16/2017 FINAL  Final    RADIOLOGY:  No results found.  EKG:   Orders placed or performed during the hospital encounter of 12/30/17  . ED EKG  . ED EKG  . EKG 12-Lead  . EKG 12-Lead    Management plans discussed with the patient, family and they are in agreement.  CODE STATUS: Full.    Code Status Orders  (From admission, onward)        Start     Ordered   12/30/17 1231  Full code  Continuous     12/30/17 1231    Code Status History    Date Active Date Inactive Code Status Order ID Comments User Context   11/15/2017 1626 11/18/2017 2021 Full Code [id number]  Loletha Grayer, MD ED      TOTAL TIME TAKING CARE OF THIS PATIENT: 35 minutes.    Vaughan Basta M.D on 01/03/2018 at 12:16 PM  Between 7am to 6pm - Pager - [phone]  After 6pm go to [email or web address] - password [email or web address] [facility]  Office  [phone]  CC: Primary care physician; Kirk Ruths, MD   Note: This dictation was prepared with Dragon dictation along with smaller phrase technology. Any transcriptional errors that result from this process are unintentional.

## 2018-01-03 NOTE — Discharge Instructions (Signed)
[facility] and Palliative Care Services

## 2018-03-19 ENCOUNTER — Other Ambulatory Visit: Payer: Self-pay

## 2018-03-19 ENCOUNTER — Emergency Department: Payer: Medicare Other

## 2018-03-19 ENCOUNTER — Inpatient Hospital Stay
Admission: EM | Admit: 2018-03-19 | Discharge: 2018-03-21 | DRG: 071 | Disposition: A | Discharge status: Home Health | Payer: Medicare Other | Attending: Family Medicine | Admitting: Family Medicine

## 2018-03-19 ENCOUNTER — Encounter: Payer: Self-pay | Admitting: Emergency Medicine

## 2018-03-19 DIAGNOSIS — Z9841 Cataract extraction status, right eye: Secondary | ICD-10-CM

## 2018-03-19 DIAGNOSIS — Z9114 Patient's other noncompliance with medication regimen: Secondary | ICD-10-CM

## 2018-03-19 DIAGNOSIS — M25562 Pain in left knee: Secondary | ICD-10-CM | POA: Diagnosis present

## 2018-03-19 DIAGNOSIS — Z853 Personal history of malignant neoplasm of breast: Secondary | ICD-10-CM

## 2018-03-19 DIAGNOSIS — M25561 Pain in right knee: Secondary | ICD-10-CM | POA: Diagnosis present

## 2018-03-19 DIAGNOSIS — I248 Other forms of acute ischemic heart disease: Secondary | ICD-10-CM | POA: Diagnosis present

## 2018-03-19 DIAGNOSIS — Z9013 Acquired absence of bilateral breasts and nipples: Secondary | ICD-10-CM

## 2018-03-19 DIAGNOSIS — R531 Weakness: Secondary | ICD-10-CM

## 2018-03-19 DIAGNOSIS — E860 Dehydration: Secondary | ICD-10-CM | POA: Diagnosis present

## 2018-03-19 DIAGNOSIS — H409 Unspecified glaucoma: Secondary | ICD-10-CM | POA: Diagnosis present

## 2018-03-19 DIAGNOSIS — F0390 Unspecified dementia without behavioral disturbance: Secondary | ICD-10-CM | POA: Diagnosis present

## 2018-03-19 DIAGNOSIS — G9341 Metabolic encephalopathy: Principal | ICD-10-CM | POA: Diagnosis present

## 2018-03-19 DIAGNOSIS — W19XXXA Unspecified fall, initial encounter: Secondary | ICD-10-CM

## 2018-03-19 DIAGNOSIS — Z96653 Presence of artificial knee joint, bilateral: Secondary | ICD-10-CM | POA: Diagnosis present

## 2018-03-19 DIAGNOSIS — I10 Essential (primary) hypertension: Secondary | ICD-10-CM | POA: Diagnosis present

## 2018-03-19 DIAGNOSIS — E876 Hypokalemia: Secondary | ICD-10-CM | POA: Diagnosis present

## 2018-03-19 DIAGNOSIS — Z90710 Acquired absence of both cervix and uterus: Secondary | ICD-10-CM

## 2018-03-19 DIAGNOSIS — W010XXA Fall on same level from slipping, tripping and stumbling without subsequent striking against object, initial encounter: Secondary | ICD-10-CM | POA: Diagnosis present

## 2018-03-19 DIAGNOSIS — Z8249 Family history of ischemic heart disease and other diseases of the circulatory system: Secondary | ICD-10-CM

## 2018-03-19 DIAGNOSIS — D72829 Elevated white blood cell count, unspecified: Secondary | ICD-10-CM | POA: Diagnosis present

## 2018-03-19 DIAGNOSIS — Z87891 Personal history of nicotine dependence: Secondary | ICD-10-CM

## 2018-03-19 DIAGNOSIS — G9340 Encephalopathy, unspecified: Secondary | ICD-10-CM | POA: Diagnosis present

## 2018-03-19 DIAGNOSIS — R630 Anorexia: Secondary | ICD-10-CM | POA: Diagnosis present

## 2018-03-19 DIAGNOSIS — M1A9XX0 Chronic gout, unspecified, without tophus (tophi): Secondary | ICD-10-CM | POA: Diagnosis present

## 2018-03-19 DIAGNOSIS — Z9842 Cataract extraction status, left eye: Secondary | ICD-10-CM

## 2018-03-19 DIAGNOSIS — Y92009 Unspecified place in unspecified non-institutional (private) residence as the place of occurrence of the external cause: Secondary | ICD-10-CM

## 2018-03-19 LAB — BASIC METABOLIC PANEL
Anion gap: 14 (ref 5–15)
BUN: 17 mg/dL (ref 8–23)
CHLORIDE: 104 mmol/L (ref 98–111)
CO2: 23 mmol/L (ref 22–32)
CREATININE: 0.98 mg/dL (ref 0.44–1.00)
Calcium: 11 mg/dL — ABNORMAL HIGH (ref 8.9–10.3)
GFR calc Af Amer: 58 mL/min — ABNORMAL LOW (ref >60)
GFR calc non Af Amer: 50 mL/min — ABNORMAL LOW (ref >60)
Glucose, Bld: 153 mg/dL — ABNORMAL HIGH (ref 70–99)
Potassium: 3.4 mmol/L — ABNORMAL LOW (ref 3.5–5.1)
SODIUM: 141 mmol/L (ref 135–145)

## 2018-03-19 LAB — URINALYSIS, COMPLETE (UACMP) WITH MICROSCOPIC
BACTERIA UA: NONE SEEN
Bilirubin Urine: NEGATIVE
GLUCOSE, UA: 50 mg/dL — AB
Ketones, ur: 5 mg/dL — AB
Leukocytes, UA: NEGATIVE
NITRITE: NEGATIVE
PROTEIN: 100 mg/dL — AB
Specific Gravity, Urine: 1.014 (ref 1.005–1.030)
pH: 5 (ref 5.0–8.0)

## 2018-03-19 LAB — CBC
HEMATOCRIT: 37.3 % (ref 36.0–46.0)
Hemoglobin: 12.4 g/dL (ref 12.0–15.0)
MCH: 32.4 pg (ref 26.0–34.0)
MCHC: 33.2 g/dL (ref 30.0–36.0)
MCV: 97.4 fL (ref 80.0–100.0)
Platelets: 364 10*3/uL (ref 150–400)
RBC: 3.83 MIL/uL — ABNORMAL LOW (ref 3.87–5.11)
RDW: 12.3 % (ref 11.5–15.5)
WBC: 13.6 10*3/uL — AB (ref 4.0–10.5)
nRBC: 0 % (ref 0.0–0.2)

## 2018-03-19 LAB — CK: CK TOTAL: 237 U/L — AB (ref 38–234)

## 2018-03-19 LAB — TROPONIN I: TROPONIN I: 0.39 ng/mL — AB (ref <0.03)

## 2018-03-19 MED ORDER — SODIUM CHLORIDE 0.9 % IV BOLUS
500.0000 mL | Freq: Once | INTRAVENOUS | Status: AC
  Administered 2018-03-19: 500 mL via INTRAVENOUS

## 2018-03-19 MED ORDER — ACETAMINOPHEN 500 MG PO TABS
1000.0000 mg | ORAL_TABLET | Freq: Once | ORAL | Status: AC
  Administered 2018-03-19: 1000 mg via ORAL
  Filled 2018-03-19: qty 2

## 2018-03-19 NOTE — ED Notes (Signed)
Blood drawn and sent to the lab.

## 2018-03-19 NOTE — ED Notes (Signed)
Patient transported to CT 

## 2018-03-19 NOTE — ED Notes (Signed)
Family at bedside. 

## 2018-03-19 NOTE — ED Notes (Signed)
Pt returned from CT °

## 2018-03-19 NOTE — ED Notes (Signed)
EMS pt from , fall , knee pain , no LOC

## 2018-03-19 NOTE — ED Provider Notes (Signed)
[facility] Emergency Department Provider Note  ____________________________________________  Time seen: Approximately 11:46 PM  I have reviewed the triage vital signs and the nursing notes.   HISTORY  Chief Complaint Fall   HPI Kimberly BANALES is a 84 y.o. female with a history of dementia, remote breast cancer, hypertensions was brought in for evaluation after a fall.  According to the daughter patient has had a decline over the last 2 weeks.  She has not been eating, not drinking, not taking her medications, has become weaker. Today she sat on the bed for 5 hours trying to get up unsuccessfully.  Daughter tried to stand her up several times but patient refused any help.  She found the patient on the ground around 5 PM.  Patient reports that she was trying to get up and go to the bathroom when she lost her balance and fell.  She was on her knees.  She denies any head trauma.  Patient was unable to stand up.  Unknown how long patient was on the ground for.  According to the daughter patient has been more confused as well.  Patient denies headache, neck pain or back pain, chest pain, abdominal pain, nausea or vomiting, dysuria.  She does report pain in her left knee which has started after the fall.  The pain is worse with palpation or bending of the knee.  Past Medical History:  Diagnosis Date  . Breast cancer ([place])   . Dementia ([place])   . Dislocated shoulder, right, sequela   . Glaucoma    both eyes  . Hypertension     Patient Active Problem List   Diagnosis Date Noted  . Pressure injury of skin 12/31/2017  . Protein-calorie malnutrition, severe 12/31/2017  . AKI (acute kidney injury) ([place]) 12/30/2017  . Dementia ([place]) 12/30/2017  . Acute delirium 12/30/2017  . Acute kidney injury ([place]) 11/15/2017    Past Surgical History:  Procedure Laterality Date  . ABDOMINAL HYSTERECTOMY    . BREAST SURGERY     bialteral mastectomy  . EYE SURGERY     bilateral  cataracts  . hammer toe Bilateral   . heel spur Bilateral   . JOINT REPLACEMENT     bilateral knees  . MASTECTOMY     bilateral    Prior to Admission medications   Medication Sig Start Date End Date Taking? Authorizing Provider  amLODipine (NORVASC) 5 MG tablet Take 5 mg by mouth daily.    Yes [provider]  Cholecalciferol (VITAMIN D) 2000 units tablet Take 2,000 Units by mouth daily.   Yes [provider]  colchicine-probenecid 0.5-500 MG tablet Take 1 tablet by mouth daily.   Yes [provider]  furosemide (LASIX) 20 MG tablet TAKE 1 TABLET (20 MG TOTAL) BY MOUTH ONCE DAILY AS NEEDED FOR EDEMA 02/18/18  Yes [provider]  gabapentin (NEURONTIN) 100 MG capsule Take 100 mg by mouth 2 (two) times daily. 12/25/17  Yes [provider]  mirtazapine (REMERON) 7.5 MG tablet Take 1 tablet by mouth at bedtime. 01/08/18 01/08/19 Yes [provider]  potassium chloride (K-DUR) 10 MEQ tablet TAKE 1 TABLET (10 MEQ TOTAL) BY MOUTH ONCE DAILY AS NEEDED 02/18/18  Yes [provider]  bimatoprost (LUMIGAN) 0.01 % SOLN Place 1 drop into both eyes at bedtime.    [provider]  cyanocobalamin (,VITAMIN B-12,) 1000 MCG/ML injection Inject 1 mL into the muscle every 30 (thirty) days.    [provider]  feeding supplement, ENSURE ENLIVE, (ENSURE ENLIVE) LIQD Take 237 mLs by mouth 2 (two) times daily between meals. 01/03/18   Vaughan Basta, MD  multivitamin-lutein [facility]) CAPS capsule Take 1 capsule by mouth daily. Patient not taking: Reported on 03/19/2018 01/04/18   Vaughan Basta, MD  polyethylene glycol [facility] Floria Raveling) packet Take 17 g by mouth daily as needed for mild constipation. Patient not taking: Reported on 03/19/2018 01/03/18   Vaughan Basta, MD    Allergies Patient has no known allergies.  Family History  Problem Relation Age of Onset  . Hypertension Mother     Social  History Social History   Tobacco Use  . Smoking status: Former Smoker    Types: Cigarettes    Last attempt to quit: 11/05/1948    Years since quitting: 69.4  . Smokeless tobacco: Never Used  Substance Use Topics  . Alcohol use: Yes    Comment: occassional wine  . Drug use: No    Review of Systems  Constitutional: Negative for fever. + fall, weakness Eyes: Negative for visual changes. ENT: Negative for sore throat. Neck: No neck pain  Cardiovascular: Negative for chest pain. Respiratory: Negative for shortness of breath. Gastrointestinal: Negative for abdominal pain, vomiting or diarrhea. + decreased appetite Genitourinary: Negative for dysuria. Musculoskeletal: Negative for back pain. + L knee pain Skin: Negative for rash. Neurological: Negative for headaches, weakness or numbness. Psych: No SI or HI  ____________________________________________   PHYSICAL EXAM:  VITAL SIGNS: ED Triage Vitals [03/19/18 2017]  Enc Vitals Group     BP (!) 185/86     Pulse Rate 85     Resp 16     Temp 97.6 F (36.4 C)     Temp Source Oral     SpO2 100 %     Weight      Height      Head Circumference      Peak Flow      Pain Score      Pain Loc      Pain Edu?      Excl. in [place]?     Constitutional: Alert and oriented x 2. Well appearing and in no apparent distress. HEENT:      Head: Normocephalic and atraumatic.         Eyes: Conjunctivae are normal. Sclera is non-icteric.       Mouth/Throat: Mucous membranes are moist.       Neck: Supple with no signs of meningismus. Cardiovascular: Regular rate and rhythm. No murmurs, gallops, or rubs. 2+ symmetrical distal pulses are present in all extremities. No JVD. Respiratory: Normal respiratory effort. Lungs are clear to auscultation bilaterally. No wheezes, crackles, or rhonchi.  Gastrointestinal: Soft, non tender, and non distended with positive bowel sounds. No rebound or guarding. Musculoskeletal: Nontender with normal range of  motion in all extremities. No edema, cyanosis, or erythema of extremities. Neurologic: Normal speech and language. Face is symmetric. Moving all extremities. No gross focal neurologic deficits are appreciated. Skin: Skin is warm, dry and intact. No rash noted. Psychiatric: Mood and affect are normal. Speech and behavior are normal.  ____________________________________________   LABS (all labs ordered are listed, but only abnormal results are displayed)  Labs Reviewed  BASIC METABOLIC PANEL - Abnormal; Notable for the following components:      Result Value   Potassium 3.4 (*)    Glucose, Bld 153 (*)    Calcium 11.0 (*)    GFR calc non Af Amer 50 (*)  GFR calc Af Amer 58 (*)    All other components within normal limits  CBC - Abnormal; Notable for the following components:   WBC 13.6 (*)    RBC 3.83 (*)    All other components within normal limits  URINALYSIS, COMPLETE (UACMP) WITH MICROSCOPIC - Abnormal; Notable for the following components:   Color, Urine YELLOW (*)    APPearance CLEAR (*)    Glucose, UA 50 (*)    Hgb urine dipstick SMALL (*)    Ketones, ur 5 (*)    Protein, ur 100 (*)    All other components within normal limits  TROPONIN I - Abnormal; Notable for the following components:   Troponin I 0.39 (*)    All other components within normal limits  CK - Abnormal; Notable for the following components:   Total CK 237 (*)    All other components within normal limits  TROPONIN I  CBG MONITORING, ED   ____________________________________________  EKG  ED ECG REPORT I, Rudene Re, the attending physician, personally viewed and interpreted this ECG.  Normal sinus rhythm, rate of 86, normal intervals, left axis deviation, LVH, no ST elevations or depressions.  No significant changes when compared to prior. ____________________________________________  RADIOLOGY  I have personally reviewed the images performed during this visit and I agree with the  Radiologist's read.   Interpretation by Radiologist:  Ct Head Wo Contrast  Result Date: 03/19/2018 CLINICAL DATA:  Increased weakness over the last week. [place] to the floor today. Decrease in activity. History of dementia. EXAM: CT HEAD WITHOUT CONTRAST TECHNIQUE: Contiguous axial images were obtained from the base of the skull through the vertex without intravenous contrast. COMPARISON:  11/15/2017 FINDINGS: Brain: Diffuse cerebral atrophy. Mild ventricular dilatation consistent with central atrophy. Low-attenuation changes in the deep white matter consistent with small vessel ischemia. No mass effect or midline shift. No abnormal extra-axial fluid collections. Gray-white matter junctions are distinct. Basal cisterns are not effaced. No acute intracranial hemorrhage. Vascular: Mild intracranial arterial vascular calcifications are present. Skull: Calvarium appears intact. Sinuses/Orbits: Old right medial orbital wall fracture deformity. Paranasal sinuses and mastoid air cells are clear. Other: None. IMPRESSION: No acute intracranial abnormalities. Chronic atrophy and small vessel ischemic changes. Electronically Signed   By: Lucienne Capers M.D.   On: 03/19/2018 23:06   Dg Knee Complete 4 Views Left  Result Date: 03/19/2018 CLINICAL DATA:  increase in weakness x1 week and today fell to the floor when trying to stand. Per daughter pt has had decrease in activity as well as intake. Pt with hx/ dementia. EXAM: LEFT KNEE - COMPLETE 4+ VIEW COMPARISON:  None. FINDINGS: Total knee prosthesis without fracture or complicating feature observed. No significant bony abnormality. IMPRESSION: 1. No acute findings. 2. Total knee prosthesis. Electronically Signed   By: Van Clines M.D.   On: 03/19/2018 20:42     ____________________________________________   PROCEDURES  Procedure(s) performed: None Procedures Critical Care performed:  None ____________________________________________   INITIAL  IMPRESSION / ASSESSMENT AND PLAN / ED COURSE  84 y.o. female with a history of dementia, remote breast cancer, hypertensions was brought in for evaluation after a fall.  Patient's health has been declining for 2 weeks.  At this time she is alert and oriented x2, neurologically intact, normal vital signs and nonfocal exam.  Labs showing several abnormalities including troponin of 0.39, patient's baseline is 0.13.  EKG showing no ischemic changes.  CK slightly elevated at 237.  Troponin elevation could be due  to mild protein breakdown versus demand ischemia.  Patient also found to have hypercalcemia of 11 for which she was given fluids.  Leukocytosis with white count of 13.6 most likely stress-induced with no clinical signs of infection, negative UA and chest x-ray. Discussed with Dr. Doyle Askew for admission.      As part of my medical decision making, I reviewed the following data within the [place] notes reviewed and incorporated, Labs reviewed , EKG interpreted , Old EKG reviewed, Old chart reviewed, Radiograph reviewed , Discussed with admitting physician , Notes from prior ED visits and [place] Controlled Substance Database    Pertinent labs & imaging results that were available during my care of the patient were reviewed by me and considered in my medical decision making (see chart for details).    ____________________________________________   FINAL CLINICAL IMPRESSION(S) / ED DIAGNOSES  Final diagnoses:  Fall, initial encounter  Hypercalcemia  Demand ischemia ([place])      NEW MEDICATIONS STARTED DURING THIS VISIT:  ED Discharge Orders    None       Note:  This document was prepared using Dragon voice recognition software and may include unintentional dictation errors.    Rudene Re, MD 03/19/18 2352

## 2018-03-19 NOTE — ED Notes (Signed)
Pt sleeping quietly

## 2018-03-19 NOTE — ED Triage Notes (Signed)
Pt arrived to ED triage with EMS. Pt unable to stand or bare weight. Pt taken to [facility]. Per EMS pt has had increase in weakness x1 week and today fell to the floor when trying to stand. Per daughter pt has had decrease in activity as well as intake. Pt with hx/ dementia.

## 2018-03-20 ENCOUNTER — Inpatient Hospital Stay
Admit: 2018-03-20 | Discharge: 2018-03-20 | Disposition: A | Discharge status: Home / Self Care | Payer: Medicare Other | Attending: Internal Medicine | Admitting: Internal Medicine

## 2018-03-20 ENCOUNTER — Inpatient Hospital Stay: Payer: Medicare Other

## 2018-03-20 DIAGNOSIS — G9340 Encephalopathy, unspecified: Secondary | ICD-10-CM | POA: Diagnosis present

## 2018-03-20 DIAGNOSIS — Z96653 Presence of artificial knee joint, bilateral: Secondary | ICD-10-CM | POA: Diagnosis present

## 2018-03-20 DIAGNOSIS — M25561 Pain in right knee: Secondary | ICD-10-CM | POA: Diagnosis present

## 2018-03-20 DIAGNOSIS — Z9114 Patient's other noncompliance with medication regimen: Secondary | ICD-10-CM | POA: Diagnosis not present

## 2018-03-20 DIAGNOSIS — Y92009 Unspecified place in unspecified non-institutional (private) residence as the place of occurrence of the external cause: Secondary | ICD-10-CM | POA: Diagnosis not present

## 2018-03-20 DIAGNOSIS — D72829 Elevated white blood cell count, unspecified: Secondary | ICD-10-CM | POA: Diagnosis present

## 2018-03-20 DIAGNOSIS — E860 Dehydration: Secondary | ICD-10-CM | POA: Diagnosis present

## 2018-03-20 DIAGNOSIS — W010XXA Fall on same level from slipping, tripping and stumbling without subsequent striking against object, initial encounter: Secondary | ICD-10-CM | POA: Diagnosis present

## 2018-03-20 DIAGNOSIS — I10 Essential (primary) hypertension: Secondary | ICD-10-CM | POA: Diagnosis present

## 2018-03-20 DIAGNOSIS — M1A9XX0 Chronic gout, unspecified, without tophus (tophi): Secondary | ICD-10-CM | POA: Diagnosis present

## 2018-03-20 DIAGNOSIS — E876 Hypokalemia: Secondary | ICD-10-CM | POA: Diagnosis present

## 2018-03-20 DIAGNOSIS — R630 Anorexia: Secondary | ICD-10-CM | POA: Diagnosis present

## 2018-03-20 DIAGNOSIS — M25562 Pain in left knee: Secondary | ICD-10-CM | POA: Diagnosis present

## 2018-03-20 DIAGNOSIS — Z853 Personal history of malignant neoplasm of breast: Secondary | ICD-10-CM | POA: Diagnosis not present

## 2018-03-20 DIAGNOSIS — F0390 Unspecified dementia without behavioral disturbance: Secondary | ICD-10-CM | POA: Diagnosis present

## 2018-03-20 DIAGNOSIS — Z87891 Personal history of nicotine dependence: Secondary | ICD-10-CM | POA: Diagnosis not present

## 2018-03-20 DIAGNOSIS — Z9841 Cataract extraction status, right eye: Secondary | ICD-10-CM | POA: Diagnosis not present

## 2018-03-20 DIAGNOSIS — Z90710 Acquired absence of both cervix and uterus: Secondary | ICD-10-CM | POA: Diagnosis not present

## 2018-03-20 DIAGNOSIS — Z9842 Cataract extraction status, left eye: Secondary | ICD-10-CM | POA: Diagnosis not present

## 2018-03-20 DIAGNOSIS — H409 Unspecified glaucoma: Secondary | ICD-10-CM | POA: Diagnosis present

## 2018-03-20 DIAGNOSIS — Z9013 Acquired absence of bilateral breasts and nipples: Secondary | ICD-10-CM | POA: Diagnosis not present

## 2018-03-20 DIAGNOSIS — G9341 Metabolic encephalopathy: Secondary | ICD-10-CM | POA: Diagnosis present

## 2018-03-20 DIAGNOSIS — Z8249 Family history of ischemic heart disease and other diseases of the circulatory system: Secondary | ICD-10-CM | POA: Diagnosis not present

## 2018-03-20 DIAGNOSIS — I248 Other forms of acute ischemic heart disease: Secondary | ICD-10-CM | POA: Diagnosis present

## 2018-03-20 LAB — BASIC METABOLIC PANEL
Anion gap: 9 (ref 5–15)
BUN: 20 mg/dL (ref 8–23)
CHLORIDE: 109 mmol/L (ref 98–111)
CO2: 25 mmol/L (ref 22–32)
Calcium: 10.4 mg/dL — ABNORMAL HIGH (ref 8.9–10.3)
Creatinine, Ser: 1 mg/dL (ref 0.44–1.00)
GFR calc Af Amer: 57 mL/min — ABNORMAL LOW (ref >60)
GFR, EST NON AFRICAN AMERICAN: 49 mL/min — AB (ref >60)
GLUCOSE: 113 mg/dL — AB (ref 70–99)
POTASSIUM: 3 mmol/L — AB (ref 3.5–5.1)
Sodium: 143 mmol/L (ref 135–145)

## 2018-03-20 LAB — CBC
HEMATOCRIT: 32.2 % — AB (ref 36.0–46.0)
HEMOGLOBIN: 10.5 g/dL — AB (ref 12.0–15.0)
MCH: 32 pg (ref 26.0–34.0)
MCHC: 32.6 g/dL (ref 30.0–36.0)
MCV: 98.2 fL (ref 80.0–100.0)
Platelets: 329 10*3/uL (ref 150–400)
RBC: 3.28 MIL/uL — AB (ref 3.87–5.11)
RDW: 12.3 % (ref 11.5–15.5)
WBC: 11 10*3/uL — ABNORMAL HIGH (ref 4.0–10.5)
nRBC: 0 % (ref 0.0–0.2)

## 2018-03-20 LAB — GLUCOSE, CAPILLARY
GLUCOSE-CAPILLARY: 86 mg/dL (ref 70–99)
GLUCOSE-CAPILLARY: 105 mg/dL — AB (ref 70–99)
GLUCOSE-CAPILLARY: 132 mg/dL — AB (ref 70–99)
Glucose-Capillary: 82 mg/dL (ref 70–99)
Glucose-Capillary: 71 mg/dL (ref 70–99)

## 2018-03-20 LAB — TROPONIN I
Troponin I: 0.34 ng/mL (ref <0.03)
Troponin I: 0.33 ng/mL (ref <0.03)

## 2018-03-20 LAB — ECHOCARDIOGRAM COMPLETE

## 2018-03-20 MED ORDER — OCUVITE-LUTEIN PO CAPS
1.0000 | ORAL_CAPSULE | Freq: Every day | ORAL | Status: DC
  Administered 2018-03-21: 1 via ORAL
  Filled 2018-03-20 (×2): qty 1

## 2018-03-20 MED ORDER — ONDANSETRON HCL 4 MG/2ML IJ SOLN: 4.0000 mg | Freq: Four times a day (QID) | INTRAMUSCULAR | Status: DC | PRN

## 2018-03-20 MED ORDER — TRAZODONE HCL 50 MG PO TABS: 25.0000 mg | ORAL_TABLET | Freq: Every evening | ORAL | Status: DC | PRN

## 2018-03-20 MED ORDER — HYDROCODONE-ACETAMINOPHEN 5-325 MG PO TABS: 1.0000 | ORAL_TABLET | ORAL | Status: DC | PRN

## 2018-03-20 MED ORDER — LATANOPROST 0.005 % OP SOLN: 1.0000 [drp] | Freq: Every day | OPHTHALMIC | Status: DC

## 2018-03-20 MED ORDER — AMLODIPINE BESYLATE 5 MG PO TABS
5.0000 mg | ORAL_TABLET | Freq: Every day | ORAL | Status: DC
  Administered 2018-03-21: 09:00:00 5 mg via ORAL
  Filled 2018-03-20: qty 1

## 2018-03-20 MED ORDER — ACETAMINOPHEN 325 MG PO TABS: 650.0000 mg | ORAL_TABLET | Freq: Four times a day (QID) | ORAL | Status: DC | PRN

## 2018-03-20 MED ORDER — DOCUSATE SODIUM 100 MG PO CAPS
100.0000 mg | ORAL_CAPSULE | Freq: Two times a day (BID) | ORAL | Status: DC
  Administered 2018-03-20 – 2018-03-21 (×2): 100 mg via ORAL
  Filled 2018-03-20 (×2): qty 1

## 2018-03-20 MED ORDER — POTASSIUM CHLORIDE IN NACL 20-0.9 MEQ/L-% IV SOLN
INTRAVENOUS | Status: DC
  Administered 2018-03-20: 11:00:00 via INTRAVENOUS
  Filled 2018-03-20 (×2): qty 1000

## 2018-03-20 MED ORDER — HEPARIN SODIUM (PORCINE) 5000 UNIT/ML IJ SOLN
5000.0000 [IU] | Freq: Three times a day (TID) | INTRAMUSCULAR | Status: DC
  Administered 2018-03-20 – 2018-03-21 (×3): 5000 [IU] via SUBCUTANEOUS
  Filled 2018-03-20 (×3): qty 1

## 2018-03-20 MED ORDER — CYANOCOBALAMIN 1000 MCG/ML IJ SOLN: 1000.0000 ug | INTRAMUSCULAR | Status: DC

## 2018-03-20 MED ORDER — SODIUM CHLORIDE 0.9 % IV SOLN
INTRAVENOUS | Status: DC
  Administered 2018-03-20: 03:00:00 via INTRAVENOUS

## 2018-03-20 MED ORDER — ACETAMINOPHEN 650 MG RE SUPP: 650.0000 mg | Freq: Four times a day (QID) | RECTAL | Status: DC | PRN

## 2018-03-20 MED ORDER — VITAMIN D 1000 UNITS PO TABS
2000.0000 [IU] | ORAL_TABLET | Freq: Every day | ORAL | Status: DC
  Administered 2018-03-21: 2000 [IU] via ORAL
  Filled 2018-03-20: qty 2

## 2018-03-20 MED ORDER — POTASSIUM CHLORIDE CRYS ER 10 MEQ PO TBCR
10.0000 meq | EXTENDED_RELEASE_TABLET | Freq: Every day | ORAL | Status: DC
  Administered 2018-03-21: 10 meq via ORAL
  Filled 2018-03-20: qty 1

## 2018-03-20 MED ORDER — ONDANSETRON HCL 4 MG PO TABS: 4.0000 mg | ORAL_TABLET | Freq: Four times a day (QID) | ORAL | Status: DC | PRN

## 2018-03-20 MED ORDER — BISACODYL 5 MG PO TBEC: 5.0000 mg | DELAYED_RELEASE_TABLET | Freq: Every day | ORAL | Status: DC | PRN

## 2018-03-20 MED ORDER — GABAPENTIN 100 MG PO CAPS
100.0000 mg | ORAL_CAPSULE | Freq: Two times a day (BID) | ORAL | Status: DC
  Administered 2018-03-20 – 2018-03-21 (×2): 100 mg via ORAL
  Filled 2018-03-20 (×2): qty 1

## 2018-03-20 MED ORDER — POTASSIUM CHLORIDE CRYS ER 20 MEQ PO TBCR
40.0000 meq | EXTENDED_RELEASE_TABLET | Freq: Once | ORAL | Status: AC
  Administered 2018-03-20: 18:00:00 40 meq via ORAL
  Filled 2018-03-20: qty 2

## 2018-03-20 MED ORDER — ENSURE ENLIVE PO LIQD
237.0000 mL | Freq: Two times a day (BID) | ORAL | Status: DC
  Administered 2018-03-20 – 2018-03-21 (×2): 237 mL via ORAL

## 2018-03-20 MED ORDER — DEXTROSE-NACL 5-0.9 % IV SOLN
INTRAVENOUS | Status: DC
  Administered 2018-03-20 – 2018-03-21 (×2): via INTRAVENOUS

## 2018-03-20 MED ORDER — MIRTAZAPINE 15 MG PO TABS
7.5000 mg | ORAL_TABLET | Freq: Every day | ORAL | Status: DC
  Administered 2018-03-20: 22:00:00 7.5 mg via ORAL
  Filled 2018-03-20: qty 1

## 2018-03-20 NOTE — Evaluation (Signed)
Occupational Therapy Evaluation Patient Details Name: Kimberly Bartlett MRN: [id number] DOB: 1927/12/05 Today's Date: 03/20/2018    History of Present Illness Pt. is an 84 y.o. female who was admitted to [facility] with generalized weakness, and a poor appetite for approximately 2 weeks, and Acute Encephalopathy. Pt. PMHx includes: Breast CA, Dementia, and Glaucoma.   Clinical Impression   Pt. presents with weakness, limited activity tolerance, limited cognition, and limited functional mobility which limits the ability to complete basic ADL and IADL functioning. Pt. resides at resides with her family who were unavailable to provide pt.'s PLOF. Pt. performed light grooming tasks with cues, and assist for movement patterns and hand over hand assist. Pt. could benefit from OT services for ADL training, and pt. education /caregiver education about cognitive compensatory strategies, home modification, and DME. Pt. Would benefit from SNF level of care with follow-up OT services upon discharge.     Follow Up Recommendations  SNF    Equipment Recommendations       Recommendations for Other Services       Precautions / Restrictions Precautions Precautions: Fall Restrictions Weight Bearing Restrictions: No      Mobility                                    General transfer comment: Deferred. Pt. seen at bed level                                               ADL either performed or assessed with clinical judgement   ADL Overall ADL's : Needs assistance/impaired Eating/Feeding: NPO   Grooming: Moderate assistance;Bed level                                       Vision Baseline Vision/History: (TBA as appropriate)       Perception     Praxis      Pertinent Vitals/Pain       Hand Dominance     Extremity/Trunk Assessment Upper Extremity Assessment Upper Extremity Assessment: Generalized weakness(RUE shoulder ROM limitations.)           Communication Communication Communication: No difficulties   Cognition Arousal/Alertness: Awake/alert;Lethargic   Overall Cognitive Status: No family/caregiver present to determine baseline cognitive functioning                                     General Comments       Exercises     Shoulder Instructions      Home Living Family/patient expects to be discharged to:: Private residence Living Arrangements: Children Available Help at Discharge: Family;Available 24 hours/day Type of Home: House Home Access: Stairs to enter [employer] of Steps: 2 Entrance Stairs-Rails: Right Home Layout: One level               Home Equipment: None          Prior Functioning/Environment Level of Independence: Needs assistance    ADL's / Homemaking Assistance Needed: Pt. reports that her familiy assisted with these tasks at times. No family present to confirm PLOF.  OT Problem List: Decreased strength;Decreased knowledge of use of DME or AE;Impaired UE functional use;Decreased range of motion;Decreased activity tolerance      OT Treatment/Interventions: Self-care/ADL training;Therapeutic exercise;Therapeutic activities;Cognitive remediation/compensation;Patient/family education;DME and/or AE instruction    OT Goals(Current goals can be found in the care plan section) Acute Rehab OT Goals OT Goal Formulation: Patient unable to participate in goal setting  OT Frequency: Min 2X/week   Barriers to D/C:            Co-evaluation              AM-PAC PT "6 Clicks" Daily Activity     Outcome Measure Help from another person eating meals?: (NPO) Help from another person taking care of personal grooming?: A Lot Help from another person toileting, which includes using toliet, bedpan, or urinal?: Total Help from another person bathing (including washing, rinsing, drying)?: A Lot Help from another person to put on and taking off  regular upper body clothing?: A Lot Help from another person to put on and taking off regular lower body clothing?: A Lot 6 Click Score: 9   End of Session    Activity Tolerance: Patient limited by fatigue Patient left: in bed  OT Visit Diagnosis: Muscle weakness (generalized) (M62.81)                Time: 8676-1950 OT Time Calculation (min): 20 min Charges:  OT General Charges $OT Visit: 1 Visit OT Evaluation $OT Eval Low Complexity: 1 Low  Harrel Carina, MS, OTR/L  Harrel Carina 03/20/2018, 11:55 AM

## 2018-03-20 NOTE — ED Notes (Signed)
Pt resting.

## 2018-03-20 NOTE — Progress Notes (Addendum)
[facility] [place] NAME: Burna Atlas    MR#:  [id number]  DATE OF BIRTH:  [birth year]  SUBJECTIVE:  CHIEF COMPLAINT:   Chief Complaint  Patient presents with  . Fall  Patient seen and evaluated today Has generalized weakness Lethargic Not completely oriented  REVIEW OF SYSTEMS:    ROS  Could not be obtained secondary to lethargy and confusion  DRUG ALLERGIES:  No Known Allergies  VITALS:  Blood pressure (!) 181/72, pulse (!) 54, temperature (!) 97.5 F (36.4 C), temperature source Oral, resp. rate 16, SpO2 99 %.  PHYSICAL EXAMINATION:   Physical Exam  GENERAL:  84 y.o.-year-old patient lying in the bed  EYES: Pupils equal, round, reactive to light and accommodation. No scleral icterus. Extraocular muscles intact.  HEENT: Head atraumatic, normocephalic. Oropharynx dry and nasopharynx clear.  NECK:  Supple, no jugular venous distention. No thyroid enlargement, no tenderness.  LUNGS: Normal breath sounds bilaterally, no wheezing, rales, rhonchi. No use of accessory muscles of respiration.  CARDIOVASCULAR: S1, S2 normal. No murmurs, rubs, or gallops.  ABDOMEN: Soft, nontender, nondistended. Bowel sounds present. No organomegaly or mass.  EXTREMITIES: No cyanosis, clubbing or edema b/l.    NEUROLOGIC: Awake and moves all extremities Not completely oriented to time place PSYCHIATRIC: The patient is alert and oriented x 1.  SKIN: No obvious rash, lesion, or ulcer.   LABORATORY PANEL:   CBC Recent Labs  Lab 03/20/18 0523  WBC 11.0*  HGB 10.5*  HCT 32.2*  PLT 329   ------------------------------------------------------------------------------------------------------------------ Chemistries  Recent Labs  Lab 03/20/18 0523  NA 143  K 3.0*  CL 109  CO2 25  GLUCOSE 113*  BUN 20  CREATININE 1.00  CALCIUM 10.4*    ------------------------------------------------------------------------------------------------------------------  Cardiac Enzymes Recent Labs  Lab 03/19/18 2310  TROPONINI 0.34*   ------------------------------------------------------------------------------------------------------------------  RADIOLOGY:  Ct Head Wo Contrast  Result Date: 03/19/2018 CLINICAL DATA:  Increased weakness over the last week. Golden [place] to the floor today. Decrease in activity. History of dementia. EXAM: CT HEAD WITHOUT CONTRAST TECHNIQUE: Contiguous axial images were obtained from the base of the skull through the vertex without intravenous contrast. COMPARISON:  11/15/2017 FINDINGS: Brain: Diffuse cerebral atrophy. Mild ventricular dilatation consistent with central atrophy. Low-attenuation changes in the deep white matter consistent with small vessel ischemia. No mass effect or midline shift. No abnormal extra-axial fluid collections. Gray-white matter junctions are distinct. Basal cisterns are not effaced. No acute intracranial hemorrhage. Vascular: Mild intracranial arterial vascular calcifications are present. Skull: Calvarium appears intact. Sinuses/Orbits: Old right medial orbital wall fracture deformity. Paranasal sinuses and mastoid air cells are clear. Other: None. IMPRESSION: No acute intracranial abnormalities. Chronic atrophy and small vessel ischemic changes. Electronically Signed   By: Lucienne Capers M.D.   On: 03/19/2018 23:06   Dg Chest Port 1 View  Result Date: 03/20/2018 CLINICAL DATA:  Weakness EXAM: PORTABLE CHEST 1 VIEW COMPARISON:  11/15/2017 FINDINGS: Cardiac shadow is at the upper limits of normal in size but stable. Aortic calcifications are again seen. The lungs are well aerated without focal infiltrate. Mild central vascular prominence is noted without interstitial edema. IMPRESSION: Mild central vascular prominence without interstitial edema. No other focal abnormality is noted.  Electronically Signed   By: Inez Catalina M.D.   On: 03/20/2018 10:09   Dg Knee Complete 4 Views Left  Result Date: 03/19/2018 CLINICAL DATA:  increase in weakness x1 week and today fell to the floor when trying to stand. Per  daughter pt has had decrease in activity as well as intake. Pt with hx/ dementia. EXAM: LEFT KNEE - COMPLETE 4+ VIEW COMPARISON:  None. FINDINGS: Total knee prosthesis without fracture or complicating feature observed. No significant bony abnormality. IMPRESSION: 1. No acute findings. 2. Total knee prosthesis. Electronically Signed   By: Van Clines M.D.   On: 03/19/2018 20:42     ASSESSMENT AND PLAN:   84 year old female patient with history of breast cancer, dementia, hypertension, glaucoma was brought by the family to the hospital after patient was found on the floor.  She had a fall and she is lethargic and confused.  -Acute metabolic encephalopathy IV fluids Follow-up WBC count Mental status improving slowly CT head no acute abnormality Urinalysis negative  -Hypercalcemia secondary to dehydration IV hydration and follow-up calcium level  -Acute hypokalemia Replace potassium  -Elevated troponin secondary to demand ischemia versus rhabdomyolysis We will cycle troponin Follow-up echocardiogram Cardiology evaluation follow-up  -DVT prophylaxis subcu heparin    All the records are reviewed and case discussed with Care Management/Social Worker. Management plans discussed with the patient, family and they are in agreement.  CODE STATUS: Full code  DVT Prophylaxis: SCDs  TOTAL TIME TAKING CARE OF THIS PATIENT: 35 minutes.   POSSIBLE D/C IN 2 to 3 DAYS, DEPENDING ON CLINICAL CONDITION.  Saundra Shelling M.D on 03/20/2018 at 11:15 AM  Between 7am to 6pm - Pager - [phone]  After 6pm go to [email or web address] - password [email or web address] [facility]  Office  [phone]  CC: Primary care physician; Kirk Ruths, MD  Note:  This dictation was prepared with Dragon dictation along with smaller phrase technology. Any transcriptional errors that result from this process are unintentional.

## 2018-03-20 NOTE — Consult Note (Signed)
[facility] Cardiology  CARDIOLOGY CONSULT NOTE  Patient ID: Kimberly Bartlett MRN: [id number] DOB/AGE: 1928/01/05 84 y.o.  Admit date: 03/19/2018 Referring Physician Duane Boston Primary Physician [facility] Primary Cardiologist None per patient Reason for Consultation Elevated troponin  HPI: 84 year old female referred for elevated troponin.  Patient has a history of dementia, remote breast cancer, and hypertension. The patient was admitted for a recent fall and acute encephalopathy with a 2-week history of generalized weakness, poor appetite, and not taking her medications. Per the ER physician note, the patient struggled to rise from her bed yesterday, lost her balance, and fell to the ground.  The patient, however, states she has been feeling fine without chest pain, palpitations, shortness of breath, peripheral edema, weakness, or poor appetite.  Head CT negative for acute abnormality. ECG revealed sinus rhythm at a rate of 86 bpm without evidence of ischemia. Admission labs notable for WBC 13.6, calcium 11, hemoglobin 10.5, hematocrit 32.2, troponin 0.39, followed by 0.34.  Currently, the patient reports feeling fine.  Review of systems complete and found to be negative unless listed above     Past Medical History:  Diagnosis Date  . Breast cancer ([place])   . Dementia ([place])   . Dislocated shoulder, right, sequela   . Glaucoma    both eyes  . Hypertension     Past Surgical History:  Procedure Laterality Date  . ABDOMINAL HYSTERECTOMY    . BREAST SURGERY     bialteral mastectomy  . EYE SURGERY     bilateral cataracts  . hammer toe Bilateral   . heel spur Bilateral   . JOINT REPLACEMENT     bilateral knees  . MASTECTOMY     bilateral    Medications Prior to Admission  Medication Sig Dispense Refill Last Dose  . amLODipine (NORVASC) 5 MG tablet Take 5 mg by mouth daily.    03/19/2018 at Unknown time  . Cholecalciferol (VITAMIN D) 2000 units tablet Take 2,000 Units by mouth daily.    03/19/2018 at Unknown time  . colchicine-probenecid 0.5-500 MG tablet Take 1 tablet by mouth daily.   03/19/2018 at Unknown time  . furosemide (LASIX) 20 MG tablet TAKE 1 TABLET (20 MG TOTAL) BY MOUTH ONCE DAILY AS NEEDED FOR EDEMA  11 prn at prn  . gabapentin (NEURONTIN) 100 MG capsule Take 100 mg by mouth 2 (two) times daily.   03/19/2018 at Unknown time  . mirtazapine (REMERON) 7.5 MG tablet Take 1 tablet by mouth at bedtime.   Unknown at Unknown  . potassium chloride (K-DUR) 10 MEQ tablet TAKE 1 TABLET (10 MEQ TOTAL) BY MOUTH ONCE DAILY AS NEEDED  11 prn at prn  . bimatoprost (LUMIGAN) 0.01 % SOLN Place 1 drop into both eyes at bedtime.   Not Taking at Unknown time  . cyanocobalamin (,VITAMIN B-12,) 1000 MCG/ML injection Inject 1 mL into the muscle every 30 (thirty) days.   Not Taking at Unknown time  . feeding supplement, ENSURE ENLIVE, (ENSURE ENLIVE) LIQD Take 237 mLs by mouth 2 (two) times daily between meals. 237 mL 12   . multivitamin-lutein (OCUVITE-LUTEIN) CAPS capsule Take 1 capsule by mouth daily. (Patient not taking: Reported on 03/19/2018) 30 capsule 0 Not Taking at Unknown time  . polyethylene glycol (MIRALAX / GLYCOLAX) packet Take 17 g by mouth daily as needed for mild constipation. (Patient not taking: Reported on 03/19/2018) 14 each 0 Not Taking at Unknown time   Social History   Socioeconomic History  . Marital status: Widowed  Spouse name: Not on file  . Number of children: Not on file  . Years of education: Not on file  . Highest education level: Not on file  Occupational History  . Not on file  Social Needs  . Financial resource strain: Not on file  . Food insecurity:    Worry: Never true    Inability: Never true  . Transportation needs:    Medical: No    Non-medical: No  Tobacco Use  . Smoking status: Former Smoker    Types: Cigarettes    Last attempt to quit: 11/05/1948    Years since quitting: 69.4  . Smokeless tobacco: Never Used  Substance and Sexual  Activity  . Alcohol use: Yes    Comment: occassional wine  . Drug use: No  . Sexual activity: Not on file  Lifestyle  . Physical activity:    Days per week: 0 days    Minutes per session: Not on file  . Stress: Only a little  Relationships  . Social connections:    Talks on phone: More than three times a week    Gets together: More than three times a week    Attends religious service: Not on file    Active member of club or organization: Not on file    Attends meetings of clubs or organizations: More than 4 times per year    Relationship status: Widowed  . Intimate partner violence:    Fear of current or ex partner: No    Emotionally abused: No    Physically abused: No    Forced sexual activity: No  Other Topics Concern  . Not on file  Social History Narrative  . Not on file    Family History  Problem Relation Age of Onset  . Hypertension Mother       Review of systems complete and found to be negative unless listed above      PHYSICAL EXAM  General: Well developed, well nourished elderly female lying at a slight incline in bed in no acute distress HEENT:  Normocephalic and atramatic Neck:  No JVD.  Lungs: Clear bilaterally to auscultation, normal effort of breathing on room air Heart: HRRR . Normal S1 and S2 without gallops or murmurs.  Abdomen: Nondistended Msk:  Back normal, gait not assessed.  Extremities: No clubbing, cyanosis or edema.   Neuro: Alert, oriented to name. (Stated that she thought she might be in hospital and was not able to tell me her birth year) Psych:  Good affect, responds appropriately with slight confusion  Labs:   Lab Results  Component Value Date   WBC 11.0 (H) 03/20/2018   HGB 10.5 (L) 03/20/2018   HCT 32.2 (L) 03/20/2018   MCV 98.2 03/20/2018   PLT 329 03/20/2018    Recent Labs  Lab 03/20/18 0523  NA 143  K 3.0*  CL 109  CO2 25  BUN 20  CREATININE 1.00  CALCIUM 10.4*  GLUCOSE 113*   Lab Results  Component Value  Date   CKTOTAL 237 (H) 03/19/2018   TROPONINI 0.34 ([place]) 03/19/2018   No results found for: CHOL No results found for: HDL No results found for: LDLCALC No results found for: TRIG No results found for: CHOLHDL No results found for: LDLDIRECT    Radiology: Ct Head Wo Contrast  Result Date: 03/19/2018 CLINICAL DATA:  Increased weakness over the last week. [place] to the floor today. Decrease in activity. History of dementia. EXAM: CT HEAD WITHOUT CONTRAST TECHNIQUE:  Contiguous axial images were obtained from the base of the skull through the vertex without intravenous contrast. COMPARISON:  11/15/2017 FINDINGS: Brain: Diffuse cerebral atrophy. Mild ventricular dilatation consistent with central atrophy. Low-attenuation changes in the deep white matter consistent with small vessel ischemia. No mass effect or midline shift. No abnormal extra-axial fluid collections. Gray-white matter junctions are distinct. Basal cisterns are not effaced. No acute intracranial hemorrhage. Vascular: Mild intracranial arterial vascular calcifications are present. Skull: Calvarium appears intact. Sinuses/Orbits: Old right medial orbital wall fracture deformity. Paranasal sinuses and mastoid air cells are clear. Other: None. IMPRESSION: No acute intracranial abnormalities. Chronic atrophy and small vessel ischemic changes. Electronically Signed   By: Lucienne Capers M.D.   On: 03/19/2018 23:06   Dg Knee Complete 4 Views Left  Result Date: 03/19/2018 CLINICAL DATA:  increase in weakness x1 week and today fell to the floor when trying to stand. Per daughter pt has had decrease in activity as well as intake. Pt with hx/ dementia. EXAM: LEFT KNEE - COMPLETE 4+ VIEW COMPARISON:  None. FINDINGS: Total knee prosthesis without fracture or complicating feature observed. No significant bony abnormality. IMPRESSION: 1. No acute findings. 2. Total knee prosthesis. Electronically Signed   By: Van Clines M.D.   On: 03/19/2018 20:42     EKG: Sinus rhythm  ASSESSMENT AND PLAN:  1.  Borderline elevated troponin in the absence of chest pain with out evidence of ischemia on ECG.  Troponin 0.39, followed by 0.34. 2.  Hypokalemic, potassium 3.0 3.  Acute encephalopathy, head CT negative for acute abnormality.  Possibly secondary to hypercalcemia 4.  Generalized weakness in the setting of dehydration and hypercalcemia 5.  Leukocytosis, WBC 13.6 6.  Hypercalcemia, 11  Recommendations: 1.  Replenish potassium 2.  Trend cardiac enzymes 3.  2D echocardiogram 4.  Continue to monitor on telemetry  Signed: Clabe Seal PA-C 03/20/2018, 9:25 AM

## 2018-03-20 NOTE — Progress Notes (Signed)
Initial Nutrition Assessment  DOCUMENTATION CODES:   Not applicable  INTERVENTION:   -Once diet is advanced:  -Ensure Enlive po BID, each supplement provides 350 kcal and 20 grams of protein -MVI with lutein daily  NUTRITION DIAGNOSIS:   Inadequate oral intake related to dysphagia, inability to eat as evidenced by NPO status.  GOAL:   Patient will meet greater than or equal to 90% of their needs  MONITOR:   PO intake, Supplement acceptance, Diet advancement, Labs, Weight trends, Skin, I & O's  REASON FOR ASSESSMENT:   Malnutrition Screening Tool    ASSESSMENT:   Kimberly Bartlett  is a 84 y.o. female with a known history of remote breast cancer, dementia, hypertension, glaucoma and other comorbidities. Pt admitted with acute encepahlopathy.  Pt admitted with acute encephalopathy.   Spoke with pt at bedside. Pt was pleasant, however, answered most questions inaccurately. She reports that she had a good appetite PTA and is ready to eat. SHe shares she would consume meals per day ("sometimes I would eat it all, but lately here I wouldn't eat all of my food"). Pt unable to give an estimate of how much she would eat or provide a diet recall. Noted pt NPO NPO, however, was advanced to full liquids after RD visit.   Pt unsure of usual body weight or if she lost weight. Reviewed wt hx, which appears wt has been stable. However, last wt obtained from [facility] was 72.6 kg, which is what this RD used to calculate needs. Pt was on a low bed and unable to obtain bed weight at this time, so unable to assess acute weight changes.   Pt denies any chewing or swallowing difficulties.   Per PTA meds, pt was consuming Ensure and taking MVI PTA. RD will ensure these are ordered for consistency of home reigmen.   Labs reviewed: K: 3.0 (on IV supplementation).   NUTRITION - FOCUSED PHYSICAL EXAM:    Most Recent Value  Orbital Region  No depletion  Upper Arm Region  Mild depletion   Thoracic and Lumbar Region  No depletion  Buccal Region  No depletion  Temple Region  Mild depletion  Clavicle Bone Region  No depletion  Clavicle and Acromion Bone Region  No depletion  Scapular Bone Region  No depletion  Dorsal Hand  Mild depletion  Patellar Region  No depletion  Anterior Thigh Region  No depletion  Posterior Calf Region  No depletion  Edema (RD Assessment)  Mild  Hair  Reviewed  Eyes  Reviewed  Mouth  Reviewed  Skin  Reviewed  Nails  Reviewed       Diet Order:   Diet Order            Diet full liquid Room service appropriate? Yes; Fluid consistency: Thin  Diet effective now              EDUCATION NEEDS:   No education needs have been identified at this time  Skin:  Skin Assessment: Reviewed RN Assessment  Last BM:  PTA  Height:   Ht Readings from Last 1 Encounters:  03/20/18 5\' 3"  (1.6 m)    Weight:   Wt Readings from Last 1 Encounters:  03/20/18 72.1 kg    Ideal Body Weight:  52.3 kg  BMI:  Body mass index is 28.16 kg/m.  Estimated Nutritional Needs:   Kcal:  1650-1850  Protein:  80-95 grams  Fluid:  1.6-1.8 L    Lamerle Jabs A. Jimmye Norman, RD, LDN, CDE  Pager: [phone] After hours Pager: [phone]

## 2018-03-20 NOTE — Progress Notes (Signed)
*  PRELIMINARY RESULTS* Echocardiogram 2D Echocardiogram has been performed.  Kimberly Bartlett 03/20/2018, 10:17 AM

## 2018-03-20 NOTE — Evaluation (Signed)
Physical Therapy Evaluation Patient Details Name: Kimberly Bartlett MRN: [id number] DOB: 13-Oct-1927 Today's Date: 03/20/2018   History of Present Illness  Pt is an 84 year old female with a history of breast cancer, dementia, hypertension, and glaucoma who was brought by the family to the hospital after patient was found on the floor.  She had a fall and was lethargic and confused.  Assessment includes: acute metabolic encephalopathy, hypercalcemia, hypokalemia, and elevated troponin secondary to demand ishcemia vs rhabdomyolysis.      Clinical Impression  Of note, pt's K+ 3.0 down from 3.4 with Dr. Estanislado Pandy giving verbal approval for pt to participate with PT services.  Pt presents with deficits in strength, transfers, mobility, gait, balance, and activity tolerance.  Pt required significantly increased time and effort with bed mobility tasks and cues for sequencing.  Pt required extensive assist to stand from sitting along with verbal and tactile cues for proper sequencing.  Pt was able to amb 15 feet x 2 with a RW and min A with very slow cadence, short B step length, shuffling steps, and flexed trunk posture.  Pt A&O to self and location only as was unable to provide a reliable history.  Chart review reveals pt lives at home with her daughter with steps to enter the home with pt confirming this and stating only intermittent help is available at home.  Pt would not be safe to return to her prior living situation at this time secondary to significant deficits in functional mobility and gait along with decreased cognition.  Pt will benefit from PT services in a SNF setting upon discharge to safely address above deficits for decreased caregiver assistance and eventual return to PLOF.      Follow Up Recommendations SNF;Supervision for mobility/OOB    Equipment Recommendations  Other (comment);Rolling walker with 5" wheels(Unkown what pt currently owns secondary to cognition)    Recommendations for  Other Services       Precautions / Restrictions Precautions Precautions: Fall Restrictions Weight Bearing Restrictions: No      Mobility  Bed Mobility Overal bed mobility: Needs Assistance Bed Mobility: Supine to Sit;Sit to Supine     Supine to sit: Supervision     General bed mobility comments: Pt very slow and effortful with all bed mobility tasks with verbal cues for sequencing and to remain on task  Transfers Overall transfer level: Needs assistance Equipment used: Rolling walker (2 wheeled) Transfers: Sit to/from Stand Sit to Stand: Min assist;Mod assist         General transfer comment: Min to mod A with sit to/from stand transfers with mod verbal and tactile cues for proper sequencing  Ambulation/Gait Ambulation/Gait assistance: Min assist Gait Distance (Feet): 15 Feet x 2 Assistive device: Rolling walker (2 wheeled) Gait Pattern/deviations: Step-through pattern;Decreased step length - right;Decreased step length - left;Trunk flexed;Shuffle Gait velocity: Decreased   General Gait Details: Very slow cadence with gait with short shuffling steps and flexed turnk posture.  Min A provided at times to direct the RW.  Stairs            Wheelchair Mobility    Modified Rankin (Stroke Patients Only)       Balance Overall balance assessment: Mild deficits observed, not formally tested                                           Pertinent Vitals/Pain  Pain Assessment: No/denies pain    Home Living Family/patient expects to be discharged to:: Private residence(Pt A&O x 2 with a h/o dementia; no family to assist with history) Living Arrangements: Children Available Help at Discharge: Family;Available PRN/intermittently Type of Home: House Home Access: Stairs to enter Entrance Stairs-Rails: Right Entrance Stairs-Number of Steps: 5 Home Layout: One level Home Equipment: None      Prior Function Level of Independence: Independent       ADL's / Homemaking Assistance Needed: Pt reports Ind amb without AD with no fall history (pt did not recall fall prior to this admission), Ind with ADLs, no family available to assist with history        Hand Dominance        Extremity/Trunk Assessment   Upper Extremity Assessment Upper Extremity Assessment: Defer to OT evaluation    Lower Extremity Assessment Lower Extremity Assessment: Generalized weakness       Communication   Communication: No difficulties  Cognition Arousal/Alertness: Awake/alert Behavior During Therapy: WFL for tasks assessed/performed Overall Cognitive Status: No family/caregiver present to determine baseline cognitive functioning                                        General Comments      Exercises Other Exercises Other Exercises: Sit to/from stand transfer training from various height surfaces   Assessment/Plan    PT Assessment Patient needs continued PT services  PT Problem List Decreased strength;Decreased activity tolerance;Decreased balance;Decreased mobility;Decreased cognition       PT Treatment Interventions DME instruction;Gait training;Stair training;Functional mobility training;Therapeutic exercise;Therapeutic activities;Balance training;Patient/family education    PT Goals (Current goals can be found in the Care Plan section)  Acute Rehab PT Goals PT Goal Formulation: Patient unable to participate in goal setting Time For Goal Achievement: 04/02/18 Potential to Achieve Goals: Good    Frequency Min 2X/week   Barriers to discharge Decreased caregiver support;Inaccessible home environment      Co-evaluation               AM-PAC PT "6 Clicks" Daily Activity  Outcome Measure Difficulty turning over in bed (including adjusting bedclothes, sheets and blankets)?: A Lot Difficulty moving from lying on back to sitting on the side of the bed? : A Lot Difficulty sitting down on and standing up from a  chair with arms (e.g., wheelchair, bedside commode, etc,.)?: Unable Help needed moving to and from a bed to chair (including a wheelchair)?: A Little Help needed walking in hospital room?: A Lot Help needed climbing 3-5 steps with a railing? : Total 6 Click Score: 11    End of Session Equipment Utilized During Treatment: Gait belt Activity Tolerance: Patient tolerated treatment well Patient left: in chair;with chair alarm set;with call bell/phone within reach Nurse Communication: Mobility status PT Visit Diagnosis: Unsteadiness on feet (R26.81);Muscle weakness (generalized) (M62.81);Difficulty in walking, not elsewhere classified (R26.2)    Time: 2836-6294 PT Time Calculation (min) (ACUTE ONLY): 38 min   Charges:   PT Evaluation $PT Eval Low Complexity: 1 Low PT Treatments $Gait Training: 8-22 mins       D. Scott Danyla Wattley PT, DPT 03/20/18, 2:01 PM

## 2018-03-20 NOTE — H&P (Signed)
[place] [facility] [place] NAME: Kimberly Bartlett    MR#:  [id number]  DATE OF BIRTH:  17-Apr-1928  DATE OF ADMISSION:  03/19/2018  PRIMARY CARE PHYSICIAN: Kirk Ruths, MD   REQUESTING/REFERRING PHYSICIAN:   CHIEF COMPLAINT:   Chief Complaint  Patient presents with  . Fall    HISTORY OF PRESENT ILLNESS: Kimberly Bartlett  is a 84 y.o. female with a known history of remote breast cancer, dementia, hypertension, glaucoma and other comorbidities. Patient is unable to provide reliable history at this time, due to confusion.  Most of the information was taken from reviewing the medical chart and from discussion with emergency room physician. Patient was brought to emergency room after fall at home.  She was found on the floor in her room around 5 PM by the family.  Patient is unable to say for how long she was down to the floor, but she complains of bilateral knee pain status post fall.  According to the daughter, patient is more confused than usual and she has been noted with worsening generalized weakness and poor appetite in the past 2 weeks.  No reports of cough, fever, chills, nausea, vomiting, diarrhea, bleeding. Blood test done emergency room are remarkable for elevated WBC at 13.6, elevated troponin level is 0.39, elevated CK at 237, elevated calcium at 11.  UA is negative for UTI.   EKG shows normal sinus rhythm, rate of 86, normal intervals, left axis deviation, LVH, no ST elevations or depressions.  No significant changes when compared to prior. Brain CT and bilateral knee x-rays are negative for acute abnormalities. Chest x-ray is pending. Patient is admitted for further evaluation and treatment.  PAST MEDICAL HISTORY:   Past Medical History:  Diagnosis Date  . Breast cancer ([place])   . Dementia ([place])   . Dislocated shoulder, right, sequela   . Glaucoma    both eyes  . Hypertension     PAST SURGICAL HISTORY:  Past Surgical History:   Procedure Laterality Date  . ABDOMINAL HYSTERECTOMY    . BREAST SURGERY     bialteral mastectomy  . EYE SURGERY     bilateral cataracts  . hammer toe Bilateral   . heel spur Bilateral   . JOINT REPLACEMENT     bilateral knees  . MASTECTOMY     bilateral    SOCIAL HISTORY:  Social History   Tobacco Use  . Smoking status: Former Smoker    Types: Cigarettes    Last attempt to quit: 11/05/1948    Years since quitting: 69.4  . Smokeless tobacco: Never Used  Substance Use Topics  . Alcohol use: Yes    Comment: occassional wine    FAMILY HISTORY:  Family History  Problem Relation Age of Onset  . Hypertension Mother     DRUG ALLERGIES: No Known Allergies  REVIEW OF SYSTEMS:   Unable to obtain due to patient's confusion.  MEDICATIONS AT HOME:  Prior to Admission medications   Medication Sig Start Date End Date Taking? Authorizing Provider  amLODipine (NORVASC) 5 MG tablet Take 5 mg by mouth daily.    Yes [provider]  Cholecalciferol (VITAMIN D) 2000 units tablet Take 2,000 Units by mouth daily.   Yes [provider]  colchicine-probenecid 0.5-500 MG tablet Take 1 tablet by mouth daily.   Yes [provider]  furosemide (LASIX) 20 MG tablet TAKE 1 TABLET (20 MG TOTAL) BY MOUTH ONCE DAILY AS NEEDED FOR  EDEMA 02/18/18  Yes [provider]  gabapentin (NEURONTIN) 100 MG capsule Take 100 mg by mouth 2 (two) times daily. 12/25/17  Yes [provider]  mirtazapine (REMERON) 7.5 MG tablet Take 1 tablet by mouth at bedtime. 01/08/18 01/08/19 Yes [provider]  potassium chloride (K-DUR) 10 MEQ tablet TAKE 1 TABLET (10 MEQ TOTAL) BY MOUTH ONCE DAILY AS NEEDED 02/18/18  Yes [provider]  bimatoprost (LUMIGAN) 0.01 % SOLN Place 1 drop into both eyes at bedtime.    [provider]  cyanocobalamin (,VITAMIN B-12,) 1000 MCG/ML injection Inject 1 mL into the muscle every 30 (thirty) days.    [provider]   feeding supplement, ENSURE ENLIVE, (ENSURE ENLIVE) LIQD Take 237 mLs by mouth 2 (two) times daily between meals. 01/03/18   Vaughan Basta, MD  multivitamin-lutein [facility]) CAPS capsule Take 1 capsule by mouth daily. Patient not taking: Reported on 03/19/2018 01/04/18   Vaughan Basta, MD  polyethylene glycol [facility] Floria Raveling) packet Take 17 g by mouth daily as needed for mild constipation. Patient not taking: Reported on 03/19/2018 01/03/18   Vaughan Basta, MD      PHYSICAL EXAMINATION:   VITAL SIGNS: Blood pressure 123/60, pulse 61, temperature 97.6 F (36.4 C), temperature source Oral, resp. rate 16, SpO2 99 %.  GENERAL:  84 y.o.-year-old patient lying in the bed with no acute distress.  Patient appears confused, drowsy. EYES: Pupils equal, round, reactive to light and accommodation. No scleral icterus. Extraocular muscles intact.  HEENT: Head atraumatic, normocephalic. Oropharynx and nasopharynx clear.  Dry mucous membrane is noted. NECK:  Supple, no jugular venous distention. No thyroid enlargement, no tenderness.  LUNGS: Normal breath sounds bilaterally, no wheezing, rales,rhonchi or crepitation. No use of accessory muscles of respiration.  CARDIOVASCULAR: S1, S2 normal. No S3/S4.  ABDOMEN: Soft, nontender, nondistended. Bowel sounds present. No organomegaly or mass.  EXTREMITIES: No pedal edema, cyanosis, or clubbing.  NEUROLOGIC EXAM: Is limited due to patient's confusion and drowsiness.  No gross focal neurologic deficit noted. PSYCHIATRIC: The patient is alert and oriented x 2.  SKIN: No obvious rash, lesion, or ulcer.   LABORATORY PANEL:   CBC Recent Labs  Lab 03/19/18 2014  WBC 13.6*  HGB 12.4  HCT 37.3  PLT 364  MCV 97.4  MCH 32.4  MCHC 33.2  RDW 12.3   ------------------------------------------------------------------------------------------------------------------  Chemistries  Recent Labs  Lab 03/19/18 2014  NA 141  K  3.4*  CL 104  CO2 23  GLUCOSE 153*  BUN 17  CREATININE 0.98  CALCIUM 11.0*   ------------------------------------------------------------------------------------------------------------------ CrCl cannot be calculated (Unknown ideal weight.). ------------------------------------------------------------------------------------------------------------------ No results for input(s): TSH, T4TOTAL, T3FREE, THYROIDAB in the last 72 hours.  Invalid input(s): FREET3   Coagulation profile No results for input(s): INR, PROTIME in the last 168 hours. ------------------------------------------------------------------------------------------------------------------- No results for input(s): DDIMER in the last 72 hours. -------------------------------------------------------------------------------------------------------------------  Cardiac Enzymes Recent Labs  Lab 03/19/18 2014 03/19/18 2310  TROPONINI 0.39* 0.34*   ------------------------------------------------------------------------------------------------------------------ Invalid input(s): POCBNP  ---------------------------------------------------------------------------------------------------------------  Urinalysis    Component Value Date/Time   COLORURINE YELLOW (A) 03/19/2018 2014   APPEARANCEUR CLEAR (A) 03/19/2018 2014   LABSPEC 1.014 03/19/2018 2014   PHURINE 5.0 03/19/2018 2014   GLUCOSEU 50 (A) 03/19/2018 2014   HGBUR SMALL (A) 03/19/2018 2014   [place] NEGATIVE 03/19/2018 2014   KETONESUR 5 (A) 03/19/2018 2014   PROTEINUR 100 (A) 03/19/2018 2014   NITRITE NEGATIVE 03/19/2018 2014   LEUKOCYTESUR NEGATIVE 03/19/2018 2014  RADIOLOGY: Ct Head Wo Contrast  Result Date: 03/19/2018 CLINICAL DATA:  Increased weakness over the last week. [place] to the floor today. Decrease in activity. History of dementia. EXAM: CT HEAD WITHOUT CONTRAST TECHNIQUE: Contiguous axial images were obtained from the base of the  skull through the vertex without intravenous contrast. COMPARISON:  11/15/2017 FINDINGS: Brain: Diffuse cerebral atrophy. Mild ventricular dilatation consistent with central atrophy. Low-attenuation changes in the deep white matter consistent with small vessel ischemia. No mass effect or midline shift. No abnormal extra-axial fluid collections. Gray-white matter junctions are distinct. Basal cisterns are not effaced. No acute intracranial hemorrhage. Vascular: Mild intracranial arterial vascular calcifications are present. Skull: Calvarium appears intact. Sinuses/Orbits: Old right medial orbital wall fracture deformity. Paranasal sinuses and mastoid air cells are clear. Other: None. IMPRESSION: No acute intracranial abnormalities. Chronic atrophy and small vessel ischemic changes. Electronically Signed   By: Lucienne Capers M.D.   On: 03/19/2018 23:06   Dg Knee Complete 4 Views Left  Result Date: 03/19/2018 CLINICAL DATA:  increase in weakness x1 week and today fell to the floor when trying to stand. Per daughter pt has had decrease in activity as well as intake. Pt with hx/ dementia. EXAM: LEFT KNEE - COMPLETE 4+ VIEW COMPARISON:  None. FINDINGS: Total knee prosthesis without fracture or complicating feature observed. No significant bony abnormality. IMPRESSION: 1. No acute findings. 2. Total knee prosthesis. Electronically Signed   By: Van Clines M.D.   On: 03/19/2018 20:42    EKG: Orders placed or performed during the hospital encounter of 03/19/18  . ED EKG  . ED EKG    IMPRESSION AND PLAN:  1.  Acute encephalopathy, could be related to hypercalcemia.  Patient does look dehydrated, but kidney function is still within normal limits.  Although WBC is slightly elevated, there is no clear source of infection.  We will start supportive treatment with IV fluids and monitor patient clinically closely.  Will rule out acute coronary syndrome, continue to monitor patient on telemetry and follow  troponin levels, check 2D echo.  2.  Hypercalcemia.  We will start IV fluids and monitor calcium level closely.  Patient might require malignancy work-up with no improvement in the calcium level.  3.  Elevated troponin level, could be secondary to demand ischemia.  Will rule out ACS, as mentioned above under #1.  4.  Generalized weakness and deconditioning, likely secondary to hypercalcemia and poor p.o. intake in the past few weeks.  PT/OT is consulted for evaluation and treatment.    All the records are reviewed and case discussed with ED provider. Management plans discussed with the patient, family and they are in agreement.  CODE STATUS: Full Code Status History    Date Active Date Inactive Code Status Order ID Comments User Context   12/30/2017 1231 01/03/2018 1902 Full Code [id number]  Hillary Bow, MD ED   11/15/2017 1626 11/18/2017 2021 Full Code [id number]  Loletha Grayer, MD ED       TOTAL TIME TAKING CARE OF THIS PATIENT: 50 minutes.    Amelia Jo M.D on 03/20/2018 at 1:27 AM  Between 7am to 6pm - Pager - [phone]  After 6pm go to [email or web address] - password [email or web address] [facility] [place] [facility]  [phone]  CC: Primary care physician; Kirk Ruths, MD

## 2018-03-21 LAB — BASIC METABOLIC PANEL
Anion gap: 8 (ref 5–15)
BUN: 20 mg/dL (ref 8–23)
CO2: 24 mmol/L (ref 22–32)
CREATININE: 0.8 mg/dL (ref 0.44–1.00)
Calcium: 9.8 mg/dL (ref 8.9–10.3)
Chloride: 110 mmol/L (ref 98–111)
GFR calc Af Amer: >60 mL/min (ref >60)
GFR calc non Af Amer: >60 mL/min (ref >60)
Glucose, Bld: 108 mg/dL — ABNORMAL HIGH (ref 70–99)
Potassium: 3.5 mmol/L (ref 3.5–5.1)
SODIUM: 142 mmol/L (ref 135–145)

## 2018-03-21 LAB — CBC
HCT: 33.8 % — ABNORMAL LOW (ref 36.0–46.0)
Hemoglobin: 10.8 g/dL — ABNORMAL LOW (ref 12.0–15.0)
MCH: 31.7 pg (ref 26.0–34.0)
MCHC: 32 g/dL (ref 30.0–36.0)
MCV: 99.1 fL (ref 80.0–100.0)
Platelets: 340 10*3/uL (ref 150–400)
RBC: 3.41 MIL/uL — ABNORMAL LOW (ref 3.87–5.11)
RDW: 12.6 % (ref 11.5–15.5)
WBC: 5.8 10*3/uL (ref 4.0–10.5)
nRBC: 0 % (ref 0.0–0.2)

## 2018-03-21 LAB — GLUCOSE, CAPILLARY: Glucose-Capillary: 93 mg/dL (ref 70–99)

## 2018-03-21 LAB — CK: Total CK: 149 U/L (ref 38–234)

## 2018-03-21 NOTE — Discharge Instructions (Signed)
[facility]; Registered Nurse and Nurse Aide

## 2018-03-21 NOTE — Discharge Summary (Signed)
[name] NAME: Kimberly Bartlett    MR#:  [id number]  DATE OF BIRTH:  [birth year]  DATE OF ADMISSION:  03/19/2018 ADMITTING PHYSICIAN: Kimberly Jo, MD  DATE OF DISCHARGE: No discharge date for patient encounter.  PRIMARY CARE PHYSICIAN: Kimberly Ruths, MD    ADMISSION DIAGNOSIS:  Hypercalcemia [E83.52] Demand ischemia ([place]) [I24.8] Fall, initial encounter [W19.XXXA]  DISCHARGE DIAGNOSIS:  Active Problems:   Acute encephalopathy   SECONDARY DIAGNOSIS:   Past Medical History:  Diagnosis Date  . Breast cancer ([place])   . Dementia ([place])   . Dislocated shoulder, right, sequela   . Glaucoma    both eyes  . Hypertension     HOSPITAL COURSE:  *Acute encephalopathy Suspect due to worsening dementia Resolved Case discussed with the patient's daughter, plans to follow-up with primary care provider as well as neurologist for reevaluation, patient has had prior plans for work-up for dementia but patient has always refused to go for did not do the follow-up, hospital work-up unimpressive  *Hypercalcemia Secondary to dehydration Resolved with IV fluids  *Elevated troponin level Most likely secondary to demand ischemia Acute coronary syndrome ruled out  *Chronic generalized weakness/deconditioning Discussion with the patient and the patient's family, plans are for patient to go home with home health services  status post discharge   DISCHARGE CONDITIONS:   stable  CONSULTS OBTAINED:  Treatment Team:  Kimberly Cowman, MD  DRUG ALLERGIES:  No Known Allergies  DISCHARGE MEDICATIONS:   Allergies as of 03/21/2018   No Known Allergies     Medication List    TAKE these medications   amLODipine 5 MG tablet Commonly known as:  NORVASC Take 5 mg by mouth daily.   bimatoprost 0.01 % Soln Commonly known as:  LUMIGAN Place 1 drop into both eyes at bedtime.   colchicine-probenecid 0.5-500 MG  tablet Take 1 tablet by mouth daily.   cyanocobalamin 1000 MCG/ML injection Commonly known as:  (VITAMIN B-12) Inject 1 mL into the muscle every 30 (thirty) days.   feeding supplement (ENSURE ENLIVE) Liqd Take 237 mLs by mouth 2 (two) times daily between meals.   furosemide 20 MG tablet Commonly known as:  LASIX TAKE 1 TABLET (20 MG TOTAL) BY MOUTH ONCE DAILY AS NEEDED FOR EDEMA   gabapentin 100 MG capsule Commonly known as:  NEURONTIN Take 100 mg by mouth 2 (two) times daily.   mirtazapine 7.5 MG tablet Commonly known as:  REMERON Take 1 tablet by mouth at bedtime.   multivitamin-lutein Caps capsule Take 1 capsule by mouth daily.   polyethylene glycol packet Commonly known as:  MIRALAX / GLYCOLAX Take 17 g by mouth daily as needed for mild constipation.   potassium chloride 10 MEQ tablet Commonly known as:  K-DUR TAKE 1 TABLET (10 MEQ TOTAL) BY MOUTH ONCE DAILY AS NEEDED   Vitamin D 2000 units tablet Take 2,000 Units by mouth daily.        DISCHARGE INSTRUCTIONS:  If you experience worsening of your admission symptoms, develop shortness of breath, life threatening emergency, suicidal or homicidal thoughts you must seek medical attention immediately by calling 911 or calling your MD immediately  if symptoms less severe.  You Must read complete instructions/literature along with all the possible adverse reactions/side effects for all the Medicines you take and that have been prescribed to you. Take any new Medicines after you have completely understood and accept all the possible adverse reactions/side effects.  Please note  You were cared for by a hospitalist during your hospital stay. If you have any questions about your discharge medications or the care you received while you were in the hospital after you are discharged, you can call the unit and asked to speak with the hospitalist on call if the hospitalist that took care of you is not available. Once you are  discharged, your primary care physician will handle any further medical issues. Please note that NO REFILLS for any discharge medications will be authorized once you are discharged, as it is imperative that you return to your primary care physician (or establish a relationship with a primary care physician if you do not have one) for your aftercare needs so that they can reassess your need for medications and monitor your lab values.    Today   CHIEF COMPLAINT:   Chief Complaint  Patient presents with  . Fall    HISTORY OF PRESENT ILLNESS:  84 y.o. female with a known history of remote breast cancer, dementia, hypertension, glaucoma and other comorbidities. Patient is unable to provide reliable history at this time, due to confusion.  Most of the information was taken from reviewing the medical chart and from discussion with emergency room physician. Patient was brought to emergency room after fall at home.  She was found on the floor in her room around 5 PM by the family.  Patient is unable to say for how long she was down to the floor, but she complains of bilateral knee pain status post fall.  According to the daughter, patient is more confused than usual and she has been noted with worsening generalized weakness and poor appetite in the past 2 weeks.  No reports of cough, fever, chills, nausea, vomiting, diarrhea, bleeding. Blood test done emergency room are remarkable for elevated WBC at 13.6, elevated troponin level is 0.39, elevated CK at 237, elevated calcium at 11.  UA is negative for UTI.   EKG shows normal sinus rhythm, rate of 86, normal intervals, left axis deviation, LVH, no ST elevations or depressions.No significant changes when compared to prior. Brain CT and bilateral knee x-rays are negative for acute abnormalities. Chest x-ray is pending. Patient is admitted for further evaluation and treatment.  VITAL SIGNS:  Blood pressure (!) 178/70, pulse (!) 52, temperature 97.7 F  (36.5 C), temperature source Oral, resp. rate 16, height 5\' 3"  (1.6 m), weight 72.1 kg, SpO2 100 %.  I/O:    Intake/Output Summary (Last 24 hours) at 03/21/2018 1115 Last data filed at 03/21/2018 0854 Gross per 24 hour  Intake 1652.86 ml  Output 500 ml  Net 1152.86 ml    PHYSICAL EXAMINATION:  GENERAL:  84 y.o.-year-old patient lying in the bed with no acute distress.  EYES: Pupils equal, round, reactive to light and accommodation. No scleral icterus. Extraocular muscles intact.  HEENT: Head atraumatic, normocephalic. Oropharynx and nasopharynx clear.  NECK:  Supple, no jugular venous distention. No thyroid enlargement, no tenderness.  LUNGS: Normal breath sounds bilaterally, no wheezing, rales,rhonchi or crepitation. No use of accessory muscles of respiration.  CARDIOVASCULAR: S1, S2 normal. No murmurs, rubs, or gallops.  ABDOMEN: Soft, non-tender, non-distended. Bowel sounds present. No organomegaly or mass.  EXTREMITIES: No pedal edema, cyanosis, or clubbing.  NEUROLOGIC: Cranial nerves II through XII are intact. Muscle strength 5/5 in all extremities. Sensation intact. Gait not checked.  PSYCHIATRIC: The patient is alert and oriented x 3.  SKIN: No obvious rash, lesion, or ulcer.  DATA REVIEW:   CBC Recent Labs  Lab 03/21/18 0348  WBC 5.8  HGB 10.8*  HCT 33.8*  PLT 340    Chemistries  Recent Labs  Lab 03/21/18 0348  NA 142  K 3.5  CL 110  CO2 24  GLUCOSE 108*  BUN 20  CREATININE 0.80  CALCIUM 9.8    Cardiac Enzymes Recent Labs  Lab 03/20/18 1142  TROPONINI 0.33*    Microbiology Results  Results for orders placed or performed during the hospital encounter of 11/15/17  Urine Culture     Status: Abnormal   Collection Time: 11/15/17  4:28 PM  Result Value Ref Range Status   Specimen Description   Final    URINE, RANDOM Performed at [facility], [address], [place], [place] [address]    Special Requests   Final    Normal Performed  at [facility], [address] [place], [place] [address]    Culture MULTIPLE SPECIES PRESENT, SUGGEST RECOLLECTION (A)  Final   Report Status 11/16/2017 FINAL  Final    RADIOLOGY:  Ct Head Wo Contrast  Result Date: 03/19/2018 CLINICAL DATA:  Increased weakness over the last week. Golden Circle to the floor today. Decrease in activity. History of dementia. EXAM: CT HEAD WITHOUT CONTRAST TECHNIQUE: Contiguous axial images were obtained from the base of the skull through the vertex without intravenous contrast. COMPARISON:  11/15/2017 FINDINGS: Brain: Diffuse cerebral atrophy. Mild ventricular dilatation consistent with central atrophy. Low-attenuation changes in the deep white matter consistent with small vessel ischemia. No mass effect or midline shift. No abnormal extra-axial fluid collections. Gray-white matter junctions are distinct. Basal cisterns are not effaced. No acute intracranial hemorrhage. Vascular: Mild intracranial arterial vascular calcifications are present. Skull: Calvarium appears intact. Sinuses/Orbits: Old right medial orbital wall fracture deformity. Paranasal sinuses and mastoid air cells are clear. Other: None. IMPRESSION: No acute intracranial abnormalities. Chronic atrophy and small vessel ischemic changes. Electronically Signed   By: Lucienne Capers M.D.   On: 03/19/2018 23:06   Dg Chest Port 1 View  Result Date: 03/20/2018 CLINICAL DATA:  Weakness EXAM: PORTABLE CHEST 1 VIEW COMPARISON:  11/15/2017 FINDINGS: Cardiac shadow is at the upper limits of normal in size but stable. Aortic calcifications are again seen. The lungs are well aerated without focal infiltrate. Mild central vascular prominence is noted without interstitial edema. IMPRESSION: Mild central vascular prominence without interstitial edema. No other focal abnormality is noted. Electronically Signed   By: Inez Catalina M.D.   On: 03/20/2018 10:09   Dg Knee Complete 4 Views Left  Result Date:  03/19/2018 CLINICAL DATA:  increase in weakness x1 week and today fell to the floor when trying to stand. Per daughter pt has had decrease in activity as well as intake. Pt with hx/ dementia. EXAM: LEFT KNEE - COMPLETE 4+ VIEW COMPARISON:  None. FINDINGS: Total knee prosthesis without fracture or complicating feature observed. No significant bony abnormality. IMPRESSION: 1. No acute findings. 2. Total knee prosthesis. Electronically Signed   By: Van Clines M.D.   On: 03/19/2018 20:42    EKG:   Orders placed or performed during the hospital encounter of 03/19/18  . ED EKG  . ED EKG    Management plans discussed with the patient, family and they are in agreement.  CODE STATUS:     Code Status Orders  (From admission, onward)         Start     Ordered   03/20/18 0130  Full  code  Continuous     03/20/18 0129        Code Status History    Date Active Date Inactive Code Status Order ID Comments User Context   12/30/2017 1231 01/03/2018 1902 Full Code [id number]  Hillary Bow, MD ED   11/15/2017 1626 11/18/2017 2021 Full Code [id number]  Loletha Grayer, MD ED      TOTAL TIME TAKING CARE OF THIS PATIENT: 40 minutes.    Avel Peace Salary M.D on 03/21/2018 at 11:15 AM  Between 7am to 6pm - Pager - [phone]  After 6pm go to [email or web address] - password [email or web address] [facility]  Office  [phone]  CC: Primary care physician; Kimberly Ruths, MD   Note: This dictation was prepared with Dragon dictation along with smaller phrase technology. Any transcriptional errors that result from this process are unintentional.

## 2018-03-21 NOTE — Progress Notes (Signed)
MD order received in [facility] to discharge pt home with home health today; [place] with Care Management previously established home health PT, RN and Aide services with [facility]; AVS reviewed with pt and pt's daughter; no questions voiced at this time; pt discharged via wheelchair by nursing to the visitor's entrance

## 2018-03-21 NOTE — Care Management Note (Signed)
Case Management Note  Patient Details  Name: Kimberly Bartlett MRN: [id number] Date of Birth: 04/03/1928  Subjective/Objective:  Patient to be discharged per MD order. Orders in place for home health services. Patient has increasing altered mental status from dementia. Spoke with patient and daughter about home health. Per preference, referral placed with [facility]. Spoke with Malachy Mood who agreed to accept the patient who PT,RN and aide services. No DME needs. Family to provide transportation home.  Ines Bloomer RN BSN RNCM [phone]                    Action/Plan:   Expected Discharge Date:  03/21/18               Expected Discharge Plan:  [place]  In-House Referral:     Discharge planning Services  CM Consult  Post Acute Care Choice:  Home Health Choice offered to:  Patient, Adult Children  DME Arranged:    DME Agency:     HH Arranged:  RN, PT, Nurse's Aide HH Agency:  [facility]  Status of Service:  Completed, signed off  If discussed at [place] of Stay Meetings, dates discussed:    Additional Comments:  Latanya Maudlin, RN 03/21/2018, 1:09 PM

## 2018-05-14 ENCOUNTER — Telehealth: Payer: Self-pay

## 2018-05-14 NOTE — Telephone Encounter (Signed)
Palliative Care SW telephone call to daughter [name] regarding information on Adult Day Care programs per her request to NP. Received no answer, left message with name and contact information for [facility] in [place]. Left contact information for this SW in case of any questions.

## 2018-09-22 ENCOUNTER — Telehealth: Payer: Self-pay | Admitting: Primary Care

## 2018-09-22 NOTE — Telephone Encounter (Signed)
Called 2 numbers on file for patient, neither are working numbers, Called emergency contact Carma Leaven, no answer, left message to return call to schedule palliative follow up.

## 2018-10-21 ENCOUNTER — Telehealth: Payer: Self-pay | Admitting: Primary Care

## 2018-10-21 NOTE — Telephone Encounter (Signed)
Phoned again to schedule palliative f/u. Non working number at pt home, left message for call back from Carma Leaven, emergency contact.

## 2018-12-08 ENCOUNTER — Telehealth: Payer: Self-pay | Admitting: Primary Care

## 2018-12-08 NOTE — Telephone Encounter (Signed)
T/c to set up palliative visit. Message and number left for [facility].

## 2018-12-15 ENCOUNTER — Telehealth: Payer: Self-pay | Admitting: Primary Care

## 2018-12-15 NOTE — Telephone Encounter (Signed)
Contacted patient's daughter [name] in an attempt to schedule a Palliative f/u visit, no answer.  Left message explaining that the NP and myself have called several times in an attempt to schedule a f/u visit with her Mom.  I also asked that she call me back within a couple of days and if I didn't hear back from her that would contact her PCP to let them know that we have been able to reach anyone to schedule the visits.  Left my name and contact information requesting a call back.

## 2018-12-15 NOTE — Telephone Encounter (Signed)
Called patient's home number but this is not a working number.

## 2018-12-28 ENCOUNTER — Telehealth: Payer: Self-pay | Admitting: Primary Care

## 2018-12-28 NOTE — Telephone Encounter (Signed)
T/c To Carma Leaven, Daughter states no one called but several calls are on record. Appt made for 01/08/2019 at 330 pm.

## 2019-01-08 ENCOUNTER — Telehealth: Payer: Self-pay | Admitting: Primary Care

## 2019-01-08 ENCOUNTER — Other Ambulatory Visit: Payer: Self-pay

## 2019-01-08 ENCOUNTER — Other Ambulatory Visit: Payer: Federal, State, Local not specified - PPO | Admitting: Primary Care

## 2019-01-08 NOTE — Telephone Encounter (Signed)
T/c from daughter, patient is in hospital. Will advise liaison.

## 2019-01-27 ENCOUNTER — Telehealth: Payer: Self-pay | Admitting: Primary Care

## 2019-01-27 NOTE — Telephone Encounter (Signed)
Call to daughter [name] again to reschedule, have not heard from her since several weeks after cancelled appointment. She states patient  is in a nursing home now in [place] getting rehab with plan to return home. We will touch base in a month or two when she gets back home.

## 2019-03-31 ENCOUNTER — Telehealth: Payer: Self-pay | Admitting: Primary Care

## 2019-03-31 NOTE — Telephone Encounter (Signed)
Spoke with daughter [name] and I have scheduled an In-person Palliative f/u visit for 04/08/19 @ 3 PM

## 2019-04-08 ENCOUNTER — Telehealth: Payer: Self-pay | Admitting: Primary Care

## 2019-04-08 ENCOUNTER — Other Ambulatory Visit: Payer: Federal, State, Local not specified - PPO | Admitting: Primary Care

## 2019-04-08 ENCOUNTER — Other Ambulatory Visit: Payer: Self-pay

## 2019-04-08 NOTE — Telephone Encounter (Signed)
T/c from daughter saying there is no electricity and they may need to cancel . They will call back.

## 2019-04-22 ENCOUNTER — Other Ambulatory Visit: Payer: Federal, State, Local not specified - PPO | Admitting: Primary Care

## 2019-04-22 ENCOUNTER — Other Ambulatory Visit: Payer: Self-pay

## 2019-04-22 DIAGNOSIS — Z515 Encounter for palliative care: Secondary | ICD-10-CM

## 2019-04-22 NOTE — Progress Notes (Signed)
[facility] Consult Note Telephone: [phone]  Fax: [phone]    PATIENT NAME: Kimberly Bartlett [address] [place] [address] [phone] (home) [phone] (work) DOB: 1927/07/28 MRN: [id number]  PRIMARY CARE PROVIDER:   Kirk Ruths, MD, [place] [address] [phone]  REFERRING PROVIDER:  Kirk Ruths, MD [place],  [place] [address] [phone]  RESPONSIBLE PARTY:   Extended Emergency Contact Information Primary Emergency Contact: Kimberly, [place] Phone: [phone] Relation: Daughter   ASSESSMENT AND RECOMMENDATIONS:   1. Advance Care Planning/Goals of Care: Goals include to maximize quality of life and symptom management. Discussed MOST form and her daughter being POA.  2. Symptom Management:   Caregiving strain: Daughter moved back down from [place] 18 months ago to care for mother. Kimberly Bartlett states overwhelmed at the care of her mother with advanced dementia, fast score 6E to 7A. She is able to ambulate but falls frequently and is incontinent. Shes suspicious and Kimberly Bartlett have the resources she needs to hire assistance or even to oversee any of her estate planning etc. I directed her to consult elder law since there are no advanced directives at this time. I encouraged that they could help provide her the access to care for her mother out of her mothers resources and for her to begin putting some other estate planning in order.   Safety: The home is quite cluttered as the patient has been a Kimberly Bartlett for many years. Kimberly Bartlett think she may have fallen because she had a sore left wrist which is not grossly injured. Currently not using ambulation device.   Advance planning: I left her the book 5 Wishes for her to begin to review for advanced directive planning and encouraged her again to  start with an attorney that could help guide her on the steps she needs. We discussed other options such as adult protective services but she did not feel she wanted to pursue that route.  Medication: We reviewed her medication. For behavior shes currently on mirtazapine and Depakote. She was pleasant to me but her daughter states she is highly suspicious of any changes. Even a visit from her other daughter has made her regress in some behaviors. I educated her on some of the signs and symptoms of Alzheimers and some of the behavior patterns which she is aware of but was glad to hear were expected.  3. Family /Caregiver/Community Supports: Has a sister in [place], 2 siblings are deceased. Daughter sold home and moved here to be caregiver. There is a cousin in the area who may help at times.  4. Cognitive / Functional decline: Alert oriented x 2. In bed and states she Is tired.Daughter reports she ambulates to bathroom alone.  5. Follow up Palliative Care Visit: Palliative care will continue to follow for goals of care clarification and symptom management. I will follow up in 4-6 weeks again and continue to give support and information  I spent 60 minutes providing this consultation,  from 1600 to 1700. More than 50% of the time in this consultation was spent coordinating communication.   HISTORY OF PRESENT ILLNESS:  Kimberly Bartlett is a 85 y.o. year old female with multiple medical problems including dementia with behavior concerns, frequent falls, HTN. Palliative Care was asked to follow this patient by consultation request of Kimberly Ruths, MD  to help address advance care planning and goals of care. This is a follow up visit. Previous palliative visit > 1 year ago. I met with patients daughter Kimberly Bartlett and patient today after several attempts to schedule.  CODE STATUS: TBD  PPS: 40% HOSPICE ELIGIBILITY/DIAGNOSIS: TBD  PAST MEDICAL HISTORY:  Past Medical History:  Diagnosis Date   Breast  cancer ([place])    Dementia ([place])    Dislocated shoulder, right, sequela    Glaucoma    both eyes   Hypertension     SOCIAL HX:  Social History   Tobacco Use   Smoking status: Former Smoker    Types: Cigarettes    Quit date: 11/05/1948    Years since quitting: 70.5   Smokeless tobacco: Never Used  Substance Use Topics   Alcohol use: Yes    Comment: occassional wine    ALLERGIES: No Known Allergies   PERTINENT MEDICATIONS:  Outpatient Encounter Medications as of 04/22/2019  Medication Sig   aspirin EC 81 MG tablet Take 81 mg by mouth daily.   atorvastatin (LIPITOR) 10 MG tablet Take 10 mg by mouth daily.   chlorthalidone (HYGROTON) 25 MG tablet Take 25 mg by mouth daily.   Cholecalciferol (VITAMIN D) 2000 units tablet Take 2,000 Units by mouth daily.   cyanocobalamin (,VITAMIN B-12,) 1000 MCG/ML injection Inject 1 mL into the muscle every 30 (thirty) days.   divalproex (DEPAKOTE) 125 MG DR tablet Take 125 mg by mouth 2 (two) times daily.   lisinopril (ZESTRIL) 40 MG tablet Take 40 mg by mouth daily.   mirtazapine (REMERON) 7.5 MG tablet Take 1 tablet by mouth at bedtime.   amLODipine (NORVASC) 5 MG tablet Take 5 mg by mouth daily.    bimatoprost (LUMIGAN) 0.01 % SOLN Place 1 drop into both eyes at bedtime.   colchicine-probenecid 0.5-500 MG tablet Take 1 tablet by mouth daily.   feeding supplement, ENSURE ENLIVE, (ENSURE ENLIVE) LIQD Take 237 mLs by mouth 2 (two) times daily between meals. (Patient not taking: Reported on 04/22/2019)   furosemide (LASIX) 20 MG tablet TAKE 1 TABLET (20 MG TOTAL) BY MOUTH ONCE DAILY AS NEEDED FOR EDEMA   gabapentin (NEURONTIN) 100 MG capsule Take 100 mg by mouth 2 (two) times daily.   multivitamin-lutein (OCUVITE-LUTEIN) CAPS capsule Take 1 capsule by mouth daily. (Patient not taking: Reported on 03/19/2018)   polyethylene glycol (MIRALAX / GLYCOLAX) packet Take 17 g by mouth daily as needed for mild constipation. (Patient not  taking: Reported on 03/19/2018)   potassium chloride (K-DUR) 10 MEQ tablet TAKE 1 TABLET (10 MEQ TOTAL) BY MOUTH ONCE DAILY AS NEEDED   No facility-administered encounter medications on file as of 04/22/2019.     PHYSICAL EXAM / ROS:   Current and past weights: unavailable General: NAD, frail appearing, thin Cardiovascular: S1s2, no chest pain reported, no edema Pulmonary: no cough, no increased SOB, lungs clear Abdomen: appetite fair, denies constipation, continent of bowel GU: denies dysuria, incontinent of urine at times MSK:  no joint deformities, ambulatory, frequent falls Skin: no rashes or wounds reported, sore left wrist no bruising, no broken skin Neurological: Weakness, dementia, dgt reports became agitated after daughter visit. Sleep is good  Jason Coop, NP  COVID-19 PATIENT SCREENING TOOL  Person answering questions: _______Carol____________ _____   1.  Is the patient or any family member in the home showing any signs or symptoms regarding respiratory infection?  Person with Symptom- _______na____________________  a. Fever                                                                          Yes___ No___          ___________________  b. Shortness of breath                                                    Yes___ No___          ___________________ c. Cough/congestion                                       Yes___  No___         ___________________ d. Body aches/pains                                                         Yes___ No___        ____________________ e. Gastrointestinal symptoms (diarrhea, nausea)           Yes___ No___        ____________________  2. Within the past 14 days, has anyone living in the home had any contact with someone with or under investigation for COVID-19?    Yes___ No_x   Person __________________

## 2019-10-27 ENCOUNTER — Telehealth: Payer: Self-pay

## 2019-10-27 NOTE — Telephone Encounter (Signed)
Volunteer support call made for palliative care, patient declined further calls and check ins

## 2019-12-09 DEATH — deceased
